# Patient Record
Sex: Female | Born: 1980 | Hispanic: No | Marital: Married | Smoking: Former smoker
Health system: Southern US, Community
[De-identification: ages and names within clinical notes are randomized; demographics above are authoritative.]

## PROBLEM LIST (undated history)

## (undated) DIAGNOSIS — M1990 Unspecified osteoarthritis, unspecified site: Secondary | ICD-10-CM

---

## 2010-07-20 ENCOUNTER — Ambulatory Visit (HOSPITAL_COMMUNITY)
Admission: RE | Admit: 2010-07-20 | Discharge: 2010-07-20 | Payer: Self-pay | Source: Home / Self Care | Attending: *Deleted | Admitting: *Deleted

## 2011-11-23 ENCOUNTER — Observation Stay (HOSPITAL_COMMUNITY)
Admission: EM | Admit: 2011-11-23 | Discharge: 2011-11-23 | Disposition: A | Discharge status: Home / Self Care | Payer: BC Managed Care – PPO | Attending: Emergency Medicine | Admitting: Emergency Medicine

## 2011-11-23 ENCOUNTER — Emergency Department (HOSPITAL_COMMUNITY): Payer: BC Managed Care – PPO

## 2011-11-23 ENCOUNTER — Encounter (HOSPITAL_COMMUNITY): Payer: Self-pay | Admitting: Emergency Medicine

## 2011-11-23 DIAGNOSIS — R112 Nausea with vomiting, unspecified: Secondary | ICD-10-CM | POA: Insufficient documentation

## 2011-11-23 DIAGNOSIS — K529 Noninfective gastroenteritis and colitis, unspecified: Secondary | ICD-10-CM

## 2011-11-23 DIAGNOSIS — R109 Unspecified abdominal pain: Principal | ICD-10-CM | POA: Insufficient documentation

## 2011-11-23 DIAGNOSIS — E860 Dehydration: Secondary | ICD-10-CM | POA: Insufficient documentation

## 2011-11-23 LAB — CBC
HCT: 38.7 % (ref 36.0–46.0)
Hemoglobin: 12.5 g/dL (ref 12.0–15.0)
MCH: 30.8 pg (ref 26.0–34.0)
MCHC: 32.3 g/dL (ref 30.0–36.0)
MCV: 95.3 fL (ref 78.0–100.0)
Platelets: 274 K/uL (ref 150–400)
RBC: 4.06 MIL/uL (ref 3.87–5.11)
RDW: 12.5 % (ref 11.5–15.5)
WBC: 8.2 K/uL (ref 4.0–10.5)

## 2011-11-23 LAB — BASIC METABOLIC PANEL
CO2: 26 mEq/L (ref 19–32)
Calcium: 9.4 mg/dL (ref 8.4–10.5)
Chloride: 102 mEq/L (ref 96–112)
Creatinine, Ser: 0.73 mg/dL (ref 0.50–1.10)
GFR calc Af Amer: >90 mL/min (ref >90)
Glucose, Bld: 87 mg/dL (ref 70–99)
Sodium: 138 mEq/L (ref 135–145)

## 2011-11-23 LAB — URINALYSIS, DIPSTICK ONLY
Bilirubin Urine: NEGATIVE
Ketones, ur: NEGATIVE mg/dL
Leukocytes, UA: NEGATIVE
Nitrite: NEGATIVE
Protein, ur: NEGATIVE mg/dL
Urobilinogen, UA: 0.2 mg/dL (ref 0.0–1.0)
pH: 6.5 (ref 5.0–8.0)

## 2011-11-23 LAB — URINALYSIS, ROUTINE W REFLEX MICROSCOPIC
Bilirubin Urine: NEGATIVE
Ketones, ur: NEGATIVE mg/dL
Nitrite: NEGATIVE
Specific Gravity, Urine: 1.029 (ref 1.005–1.030)
Urobilinogen, UA: 1 mg/dL (ref 0.0–1.0)

## 2011-11-23 LAB — DIFFERENTIAL
Eosinophils Relative: 2 % (ref 0–5)
Lymphocytes Relative: 34 % (ref 12–46)
Lymphs Abs: 2.8 10*3/uL (ref 0.7–4.0)
Monocytes Absolute: 0.6 10*3/uL (ref 0.1–1.0)
Neutro Abs: 4.6 10*3/uL (ref 1.7–7.7)

## 2011-11-23 MED ORDER — MORPHINE SULFATE 4 MG/ML IJ SOLN
4.0000 mg | Freq: Once | INTRAMUSCULAR | Status: AC
  Administered 2011-11-23: 4 mg via INTRAVENOUS
  Filled 2011-11-23: qty 1

## 2011-11-23 MED ORDER — SODIUM CHLORIDE 0.9 % IV SOLN: 1000.0000 mL | INTRAVENOUS | Status: DC

## 2011-11-23 MED ORDER — MORPHINE SULFATE 2 MG/ML IJ SOLN
2.0000 mg | INTRAMUSCULAR | Status: DC | PRN
  Administered 2011-11-23: 2 mg via INTRAVENOUS
  Filled 2011-11-23: qty 1

## 2011-11-23 MED ORDER — SODIUM CHLORIDE 0.9 % IV SOLN
INTRAVENOUS | Status: DC
  Administered 2011-11-23: 03:00:00 via INTRAVENOUS

## 2011-11-23 MED ORDER — MORPHINE SULFATE 4 MG/ML IJ SOLN
INTRAMUSCULAR | Status: AC
  Filled 2011-11-23: qty 1

## 2011-11-23 MED ORDER — ONDANSETRON 8 MG PO TBDP: 8.0000 mg | ORAL_TABLET | Freq: Three times a day (TID) | ORAL | Status: AC | PRN

## 2011-11-23 MED ORDER — ONDANSETRON HCL 4 MG/2ML IJ SOLN
INTRAMUSCULAR | Status: AC
  Filled 2011-11-23: qty 2

## 2011-11-23 MED ORDER — ONDANSETRON 4 MG PO TBDP: 8.0000 mg | ORAL_TABLET | Freq: Once | ORAL | Status: DC

## 2011-11-23 MED ORDER — ONDANSETRON HCL 4 MG/2ML IJ SOLN
4.0000 mg | Freq: Once | INTRAMUSCULAR | Status: AC
  Administered 2011-11-23: 4 mg via INTRAVENOUS

## 2011-11-23 MED ORDER — KETOROLAC TROMETHAMINE 30 MG/ML IJ SOLN
30.0000 mg | Freq: Once | INTRAMUSCULAR | Status: AC
  Administered 2011-11-23: 30 mg via INTRAVENOUS
  Filled 2011-11-23: qty 1

## 2011-11-23 MED ORDER — HYDROCODONE-ACETAMINOPHEN 5-325 MG PO TABS: 1.0000 | ORAL_TABLET | ORAL | Status: AC | PRN

## 2011-11-23 MED ORDER — MORPHINE SULFATE 4 MG/ML IJ SOLN
4.0000 mg | Freq: Once | INTRAMUSCULAR | Status: AC
  Administered 2011-11-23: 4 mg via INTRAVENOUS

## 2011-11-23 MED ORDER — ONDANSETRON 4 MG PO TBDP
8.0000 mg | ORAL_TABLET | Freq: Once | ORAL | Status: AC
  Administered 2011-11-23: 8 mg via ORAL
  Filled 2011-11-23: qty 2

## 2011-11-23 MED ORDER — ONDANSETRON HCL 4 MG/2ML IJ SOLN
4.0000 mg | Freq: Four times a day (QID) | INTRAMUSCULAR | Status: DC | PRN
  Administered 2011-11-23: 4 mg via INTRAVENOUS
  Filled 2011-11-23: qty 2

## 2011-11-23 NOTE — ED Provider Notes (Signed)
Patient in CDU under dehydration protocol.  Patient resting comfortably at present with family at bedside.  Nausea and vomiting currently controlled, is tolerating po fluids without difficulty.  Lungs CTA bilaterally.  S1/S2, RRR, no murmur.  Abdomen soft, bowel sounds present.  Lab and radiology results reviewed and discussed with patient.  Will be discharged home with prescriptions for antiemetic and analgesic.  Patient has a PCP in [place], and will follow up with him as needed.  Jimmye Norman, NP 11/23/11 1357  Prior to discharge, patient experienced episode of lightheadedness and near syncope.  Episode quickly resolved.  Patient reassessed, is not orthostatic.  CBG at 68, patient provided with snack.  Small amount of blood noted in urine recheck, leukocyte esterase and nitrate negative.  CT of A/P, as well as pelvic ultrasounds are unremarkable.  Will culture urine and contact patient if additional treatment is indicated.  Jimmye Norman, NP 11/23/11 [phone]

## 2011-11-23 NOTE — ED Notes (Signed)
Pt given 2 cups of Orange Juice & snack, pt in bed & responsive, family at bedside, vitals within normal limits, will continue to monitor

## 2011-11-23 NOTE — ED Notes (Signed)
MD at bedside. 

## 2011-11-23 NOTE — Discharge Instructions (Signed)
Clear Liquid Diet °The clear liquid diet consists of foods that are liquid or will become liquid at room temperature. You should be able to see through the liquid and beverages. Examples of foods allowed on a clear liquid diet include fruit juice, broth or bouillon, gelatin, or frozen ice pops. °The purpose of this diet is to provide necessary fluid, electrolytes such as sodium and potassium, and energy to keep the body functioning during times when you are not able to consume a regular diet. A clear liquid diet should not be continued for long periods of time as it is not nutritionally adequate.  °REASONS FOR USING A CLEAR LIQUID DIET °· In sudden onset (acute) conditions for a patient before or after surgery.  °· As the first step in oral feeding.  °· For fluid and electrolyte replacement in diarrheal diseases.  °· As a diet before certain medical tests are performed.  °ADEQUACY °The clear liquid diet is adequate only in ascorbic acid, according to the Recommended Dietary Allowances of the National Research Council. °CHOOSING FOODS °Breads and Starches °· Allowed:  None are allowed.  °· Avoid: All are avoided.  °Vegetables °· Allowed:  Strained tomato or vegetable juice.  °· Avoid: Any others.  °Fruit °· Allowed:  Strained fruit juices and fruit drinks. Include 1 serving of citrus or vitamin C-enriched fruit juice daily.  °· Avoid: Any others.  °Meat and Meat Substitutes °· Allowed:  None are allowed.  °· Avoid: All are avoided.  °Milk °· Allowed:  None are allowed.  °· Avoid: All are avoided.  °Soups and Combination Foods °· Allowed:  Clear bouillon, broth, or strained broth-based soups.  °· Avoid: Any others.  °Desserts and Sweets °· Allowed:  Sugar, honey. High protein gelatin. Flavored gelatin, ices, or frozen ice pops that do not contain milk.  °· Avoid: Any others.  °Fats and Oils °· Allowed:  None are allowed.  °· Avoid: All are avoided.  °Beverages °· Allowed: Cereal beverages, coffee (regular or  decaffeinated), tea, or soda at the discretion of your caregiver.  °· Avoid: Any others.  °Condiments °· Allowed:  Iodized salt.  °· Avoid: Any others, including pepper.  °Supplements °· Allowed:  Liquid nutrition beverages.  °· Avoid: Any others that contain lactose or fiber.  °SAMPLE MEAL PLAN °Breakfast °· 4 oz (120 mL) strained orange juice.  °· ½ to 1 cup (125 to 250 mL) gelatin (plain or fortified).  °· 1 cup (250 mL) beverage (coffee or tea).  °· Sugar, if desired.  °Midmorning Snack °· ½ cup (125 mL) gelatin (plain or fortified).  °Lunch °· 1 cup (250 mL) broth or consommé.  °· 4 oz (120 mL) strained grapefruit juice.  °· ½ cup (125 mL) gelatin (plain or fortified).  °· 1 cup (250 mL) beverage (coffee or tea).  °· Sugar, if desired.  °Midafternoon Snack °· ½ cup (125 mL) fruit ice.  °· ½ cup (125 mL) strained fruit juice.  °Dinner °· 1 cup (250 mL) broth or consommé.  °· ½ cup (125 mL) cranberry juice.  °· ½ cup (125 mL) flavored gelatin (plain or fortified).  °· 1 cup (250 mL) beverage (coffee or tea).  °· Sugar, if desired.  °Evening Snack °· 4 oz (120 mL) strained apple juice (vitamin C-fortified).  °· ½ cup (125 mL) flavored gelatin (plain or fortified).  °Document Released: 06/18/2005 Document Revised: 06/07/2011 Document Reviewed: 09/15/2010 °[employer]® Patient Information ©2012 [employer].B.R.A.T. Diet °Your doctor has recommended the B.R.A.T. diet for you or   you or your child until the condition improves. This is often used to help control diarrhea and vomiting symptoms. If you or your child can tolerate clear liquids, you may have:  Bananas.   Rice.   Applesauce.   Toast (and other simple starches such as crackers, potatoes, noodles).  Be sure to avoid dairy products, meats, and fatty foods until symptoms are better. Fruit juices such as apple, grape, and prune juice can make diarrhea worse. Avoid these. Continue this diet for 2 days or as instructed by your caregiver. Document Released:  06/18/2005 Document Revised: 06/07/2011 Document Reviewed: 12/05/2006 [facility] Patient Information 2012 [place], [place].  Nausea and Vomiting Nausea is a sick feeling that often comes before throwing up (vomiting). Vomiting is a reflex where stomach contents come out of your mouth. Vomiting can cause severe loss of body fluids (dehydration). Children and elderly adults can become dehydrated quickly, especially if they also have diarrhea. Nausea and vomiting are symptoms of a condition or disease. It is important to find the cause of your symptoms. CAUSES   Direct irritation of the stomach lining. This irritation can result from increased acid production (gastroesophageal reflux disease), infection, food poisoning, taking certain medicines (such as nonsteroidal anti-inflammatory drugs), alcohol use, or tobacco use.   Signals from the brain.These signals could be caused by a headache, heat exposure, an inner ear disturbance, increased pressure in the brain from injury, infection, a tumor, or a concussion, pain, emotional stimulus, or metabolic problems.   An obstruction in the gastrointestinal tract (bowel obstruction).   Illnesses such as diabetes, hepatitis, gallbladder problems, appendicitis, kidney problems, cancer, sepsis, atypical symptoms of a heart attack, or eating disorders.   Medical treatments such as chemotherapy and radiation.   Receiving medicine that makes you sleep (general anesthetic) during surgery.  DIAGNOSIS Your caregiver may ask for tests to be done if the problems do not improve after a few days. Tests may also be done if symptoms are severe or if the reason for the nausea and vomiting is not clear. Tests may include:  Urine tests.   Blood tests.   Stool tests.   Cultures (to look for evidence of infection).   X-rays or other imaging studies.  Test results can help your caregiver make decisions about treatment or the need for additional tests. TREATMENT You need  to stay well hydrated. Drink frequently but in small amounts.You may wish to drink water, sports drinks, clear broth, or eat frozen ice pops or gelatin dessert to help stay hydrated.When you eat, eating slowly may help prevent nausea.There are also some antinausea medicines that may help prevent nausea. HOME CARE INSTRUCTIONS   Take all medicine as directed by your caregiver.   If you do not have an appetite, do not force yourself to eat. However, you must continue to drink fluids.   If you have an appetite, eat a normal diet unless your caregiver tells you differently.   Eat a variety of complex carbohydrates (rice, wheat, potatoes, bread), lean meats, yogurt, fruits, and vegetables.   Avoid high-fat foods because they are more difficult to digest.   Drink enough water and fluids to keep your urine clear or pale yellow.   If you are dehydrated, ask your caregiver for specific rehydration instructions. Signs of dehydration may include:   Severe thirst.   Dry lips and mouth.   Dizziness.   Dark urine.   Decreasing urine frequency and amount.   Confusion.   Rapid breathing or pulse.  SEEK  IMMEDIATE MEDICAL CARE IF:   You have blood or brown flecks (like coffee grounds) in your vomit.   You have black or bloody stools.   You have a severe headache or stiff neck.   You are confused.   You have severe abdominal pain.   You have chest pain or trouble breathing.   You do not urinate at least once every 8 hours.   You develop cold or clammy skin.   You continue to vomit for longer than 24 to 48 hours.   You have a fever.  MAKE SURE YOU:   Understand these instructions.   Will watch your condition.   Will get help right away if you are not doing well or get worse.  Document Released: 06/18/2005 Document Revised: 06/07/2011 Document Reviewed: 11/15/2010 [facility] Patient Information 2012 [place], [place].

## 2011-11-23 NOTE — ED Notes (Signed)
Patient with abdominal pain and vomiting since 1900 this evening.  Patient states that it is more pain on the right side.

## 2011-11-23 NOTE — ED Notes (Addendum)
Pt reports blood in urine prior to leaving hospital, Katrinka Blazing, NP notified, pt & family updated on plan of care, pt c/o nausea

## 2011-11-23 NOTE — ED Notes (Signed)
Pt's abdomen is soft, and tender to palpation to RLQ.  Pt reports feeling "a little" pain to LLQ abdomen with palpation to R side.

## 2011-11-23 NOTE — ED Notes (Signed)
Notified lab on delay of u preg, they say they will run the test now

## 2011-11-23 NOTE — ED Notes (Signed)
Pt given graham crackers and orange juice per request.  Pt eating at this time.  Will d/c pt after eating.

## 2011-11-23 NOTE — ED Notes (Signed)
Patient in ultrasound, became unresponsive with the ultrasound technician, patient currently alert and oriented x 4, placed on monitor, vitals stable.

## 2011-11-23 NOTE — ED Notes (Signed)
Pt tolerating PO fluids without nausea

## 2011-11-23 NOTE — ED Notes (Signed)
Pt advocate to bedside.  Pt and family talking and denied any needs.

## 2011-11-23 NOTE — ED Provider Notes (Cosign Needed)
History     CSN: [id number]  Arrival date & time 11/23/11  0106   First MD Initiated Contact with Patient 11/23/11 0406      Chief Complaint  Patient presents with  . Abdominal Pain  . Emesis    (Consider location/radiation/quality/duration/timing/severity/associated sxs/prior treatment) Patient is a 31 y.o. female presenting with abdominal pain and vomiting. The history is provided by the patient and the spouse.  Abdominal Pain The primary symptoms of the illness include abdominal pain, nausea and vomiting. The primary symptoms of the illness do not include fever, diarrhea, dysuria, vaginal discharge or vaginal bleeding.  Symptoms associated with the illness do not include chills or constipation.  Emesis  Associated symptoms include abdominal pain. Pertinent negatives include no chills, no diarrhea and no fever.   the patient is a 31 year old, female, with a history of C-section x3, as well as bilateral tubal ligation.  She presents to emergency department complaining of gradual onset of right lower abdominal pain, with nausea and vomiting.  She denies diarrhea.  She denies urinary tract symptoms.  She completed her menstrual cycle.  3 days ago.  It was normal.  She does not have a vaginal discharge.  Her last meal was around 7:00 last night.  Her symptoms began around 6 PM last night.  History reviewed. No pertinent past medical history.  Past Surgical History  Procedure Date  . Cesarean section     3    History reviewed. No pertinent family history.  History  Substance Use Topics  . Smoking status: Former Games developer  . Smokeless tobacco: Not on file  . Alcohol Use: No    OB History    Grav Para Term Preterm Abortions TAB SAB Ect Mult Living                  Review of Systems  Constitutional: Negative for fever and chills.  Gastrointestinal: Positive for nausea, vomiting and abdominal pain. Negative for diarrhea, constipation and blood in stool.  Genitourinary:  Negative for dysuria, vaginal bleeding and vaginal discharge.  All other systems reviewed and are negative.    Allergies  Peanut-containing drug products; Shellfish allergy; and Iodine  Home Medications   Current Outpatient Rx  Name Route Sig Dispense Refill  . CETIRIZINE HCL 10 MG PO TABS Oral Take 10 mg by mouth daily.    Marland Kitchen CITALOPRAM HYDROBROMIDE 20 MG PO TABS Oral Take 20 mg by mouth daily.    . METHYLPHENIDATE HCL ER 18 MG PO TBCR Oral Take 18 mg by mouth every morning.      BP 133/80  Pulse 74  Temp(Src) 98 F (36.7 C) (Oral)  Resp 16  SpO2 96%  LMP 11/13/2011  Physical Exam  Nursing note and vitals reviewed. Constitutional: She is oriented to person, place, and time. She appears well-developed and well-nourished. No distress.       Uncomfortable appearing  HENT:  Head: Normocephalic and atraumatic.  Eyes: Conjunctivae are normal.  Neck: Normal range of motion. Neck supple.  Cardiovascular: Normal rate.   No murmur heard. Pulmonary/Chest: Effort normal. She has no rales.  Abdominal: Soft. Bowel sounds are normal. She exhibits no distension. There is tenderness. There is guarding. There is no rebound.       Right lower quadrant tenderness, with guarding.  No rigidity  Musculoskeletal: Normal range of motion. She exhibits no edema.  Neurological: She is alert and oriented to person, place, and time.  Skin: Skin is warm and dry.  Psychiatric:  She has a normal mood and affect. Thought content normal.    ED Course  Procedures (including critical care time) Gradual onset of right lower quadrant abdominal pain, with nausea and vomiting.  She has significant tenderness and guarding in the right side.  We will perform laboratory testing, and a CAT scan of her abdomen.  For suspected appendicitis.  Labs Reviewed  URINALYSIS, ROUTINE W REFLEX MICROSCOPIC - Abnormal; Notable for the following:    APPearance CLOUDY (*)    All other components within normal limits  CBC    DIFFERENTIAL  BASIC METABOLIC PANEL  PREGNANCY, URINE   No results found.   No diagnosis found.    MDM  Right lower quadrant abdominal pain, with nausea and vomiting        Cheri Guppy, MD 11/23/11 2307

## 2011-11-23 NOTE — ED Notes (Addendum)
Informed  By pt family that pt was unresponsive, when entering the room the pt was in bed not responding, ammonia did arouse the pt, pts respirations & pulse remained within normal limits during this time, unresponsive episode lasted about 1-2 mins, pt placed on 2 L [place] during episode, pt removed from O2 once aroused, Katrinka Blazing, NP at bedside, CBG to be tested

## 2011-11-23 NOTE — ED Notes (Signed)
Received pt from ED with continued RLQ abdominal pain.  Pt reports pain began yesterday while driving to work after watching son play T-ball.  Pt reports decreased urine output, reports period was 3 days ago and has vaginal discharge since period ended, but pt reports that is normal for her.  Pt vital signs are stable, pt reports pain is 6/10.  Female visitor at bedside.

## 2011-11-24 LAB — URINE CULTURE

## 2011-11-26 NOTE — ED Provider Notes (Signed)
Medical screening examination/treatment/procedure(s) were performed by non-physician practitioner and as supervising physician I was immediately available for consultation/collaboration.  Kennedey Digilio, MD 11/26/11 0729 

## 2013-06-25 IMAGING — US US PELVIS COMPLETE
1 series · 14 of 25 positions shown · non-contrast
Comparison: None.

CLINICAL DATA: Severe right pelvic pain.  Clinical suspicion for
ovarian torsion.

TRANSABDOMINAL AND TRANSVAGINAL ULTRASOUND OF PELVIS
DOPPLER ULTRASOUND OF OVARIES
TECHNIQUE: Both transabdominal and transvaginal ultrasound
examinations of the pelvis were performed. Transabdominal technique
was performed for global imaging of the pelvis including uterus,
ovaries, adnexal regions, and pelvic cul-de-sac.
It was necessary to proceed with endovaginal exam following the
transabdominal exam to visualize the ovaries and  adnexa.
Color and duplex Doppler ultrasound was utilized to evaluate blood
flow to the ovaries.

[Series 1: us pelvis complete · 0.28mm/px · 14 of 56 slices shown]
[im 1/56]
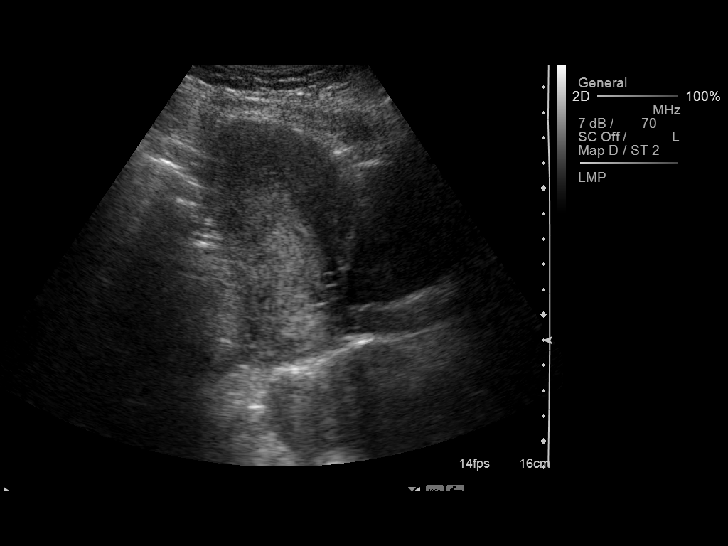
[im 5/56]
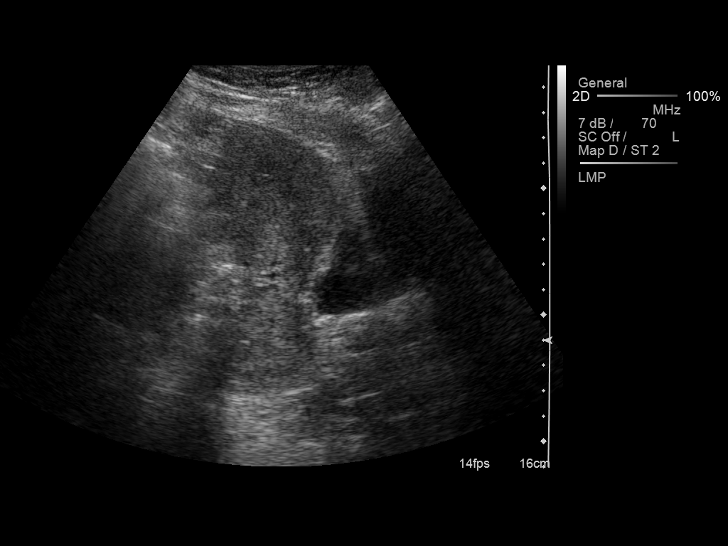
[im 10/56]
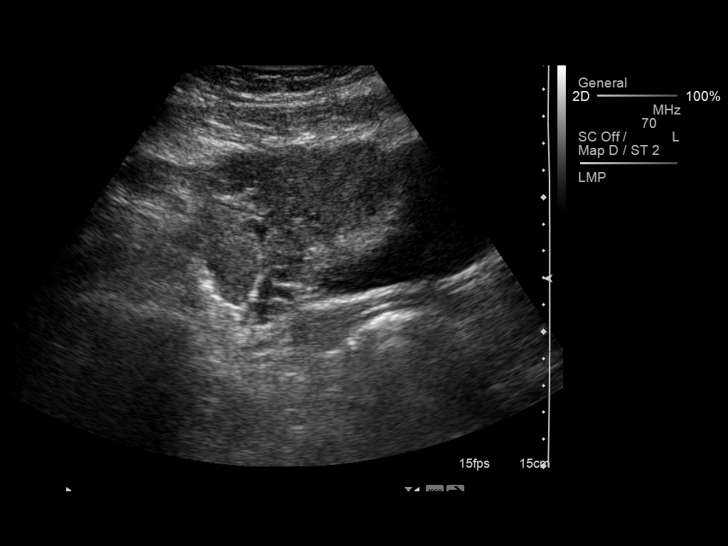
[im 14/56]
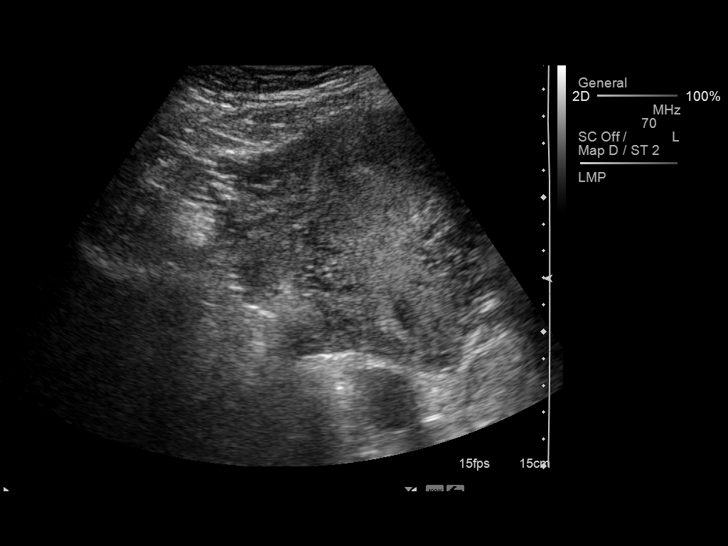
[im 19/56]
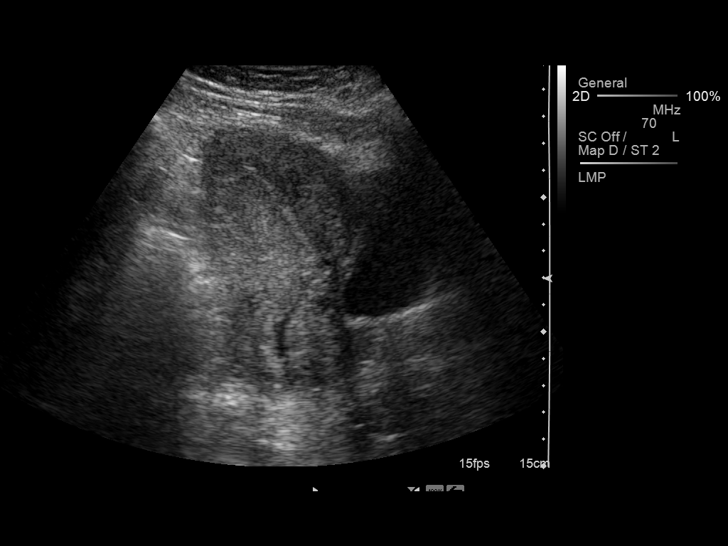
[im 21/56]
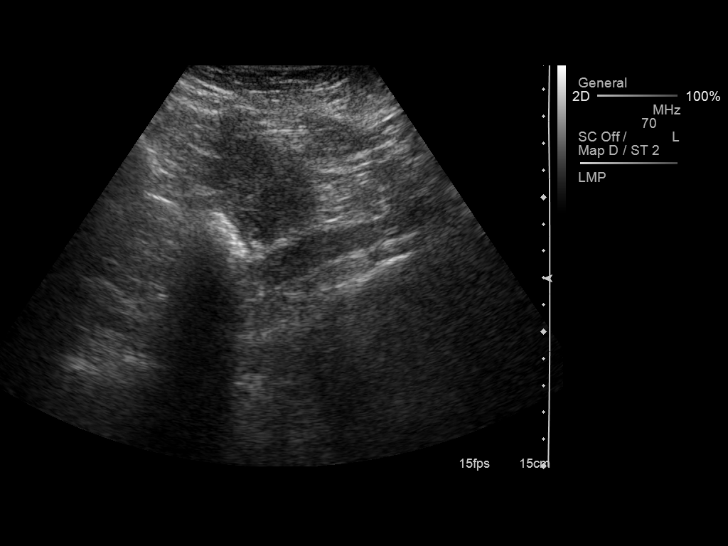
[im 26/56]
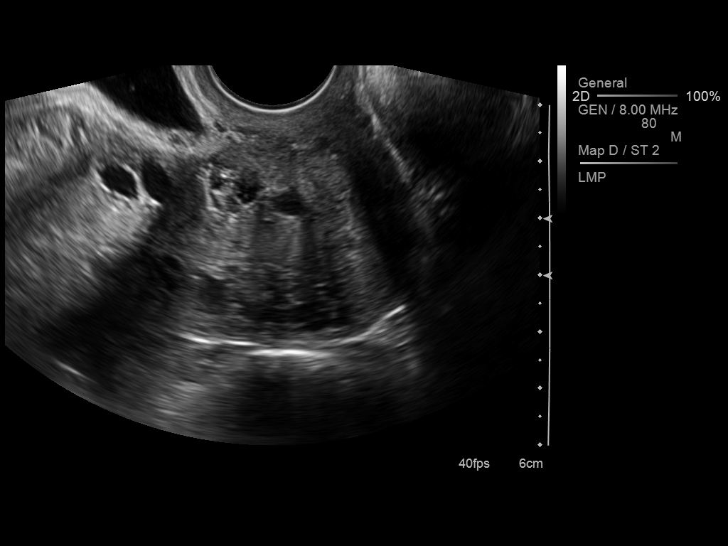
[im 30/56]
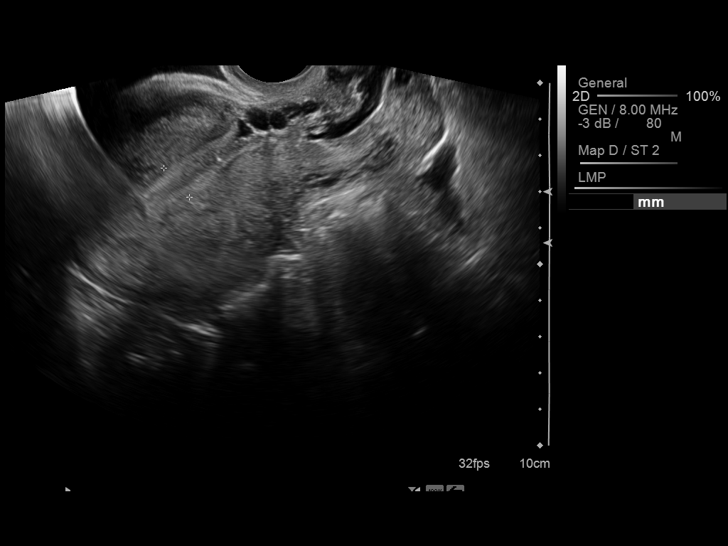
[im 35/56]
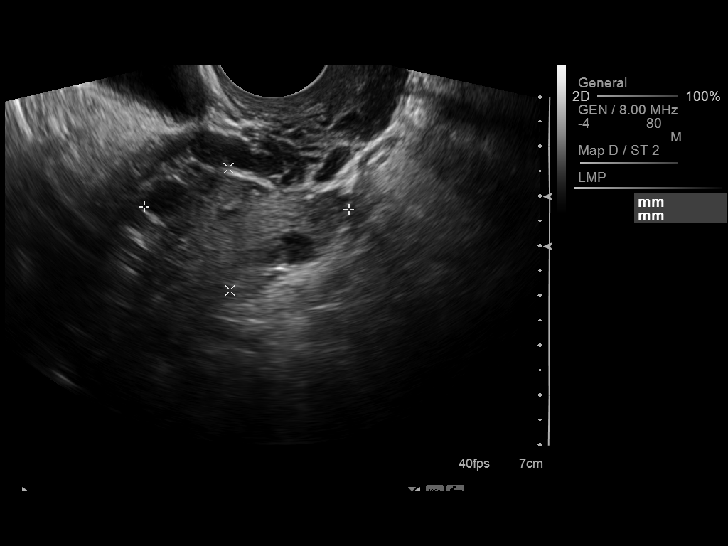
[im 37/56]
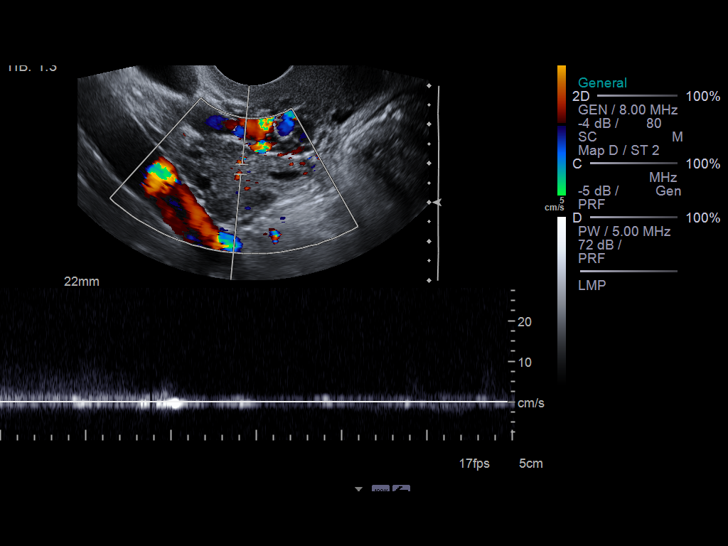
[im 42/56]
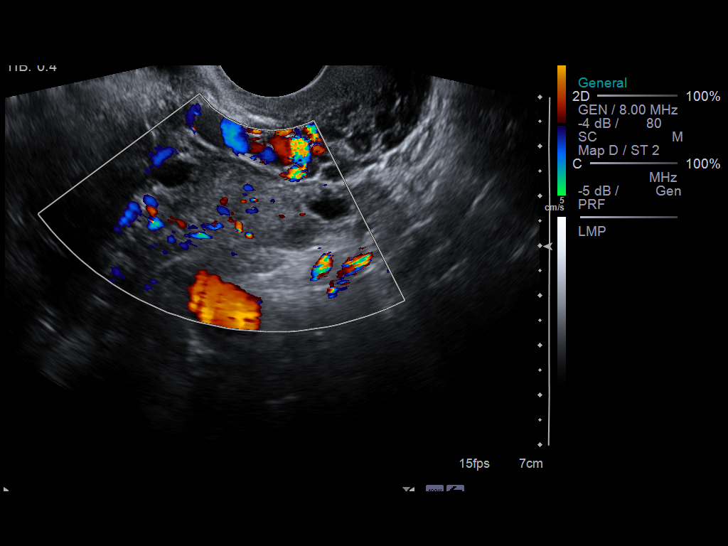
[im 46/56]
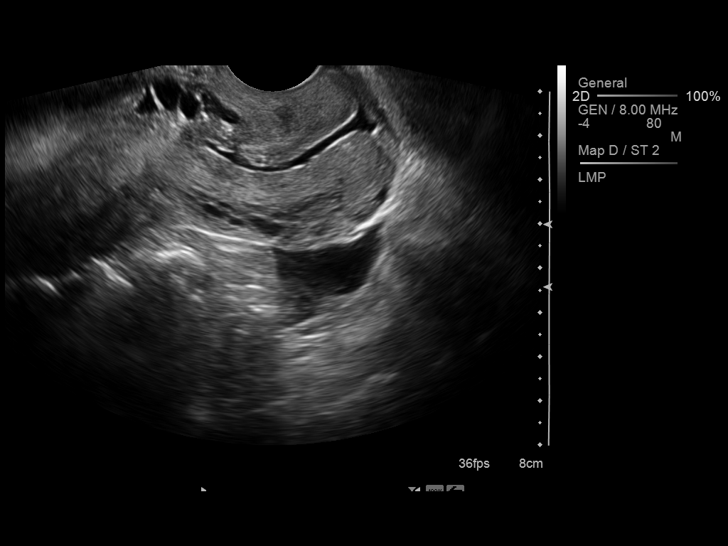
[im 51/56]
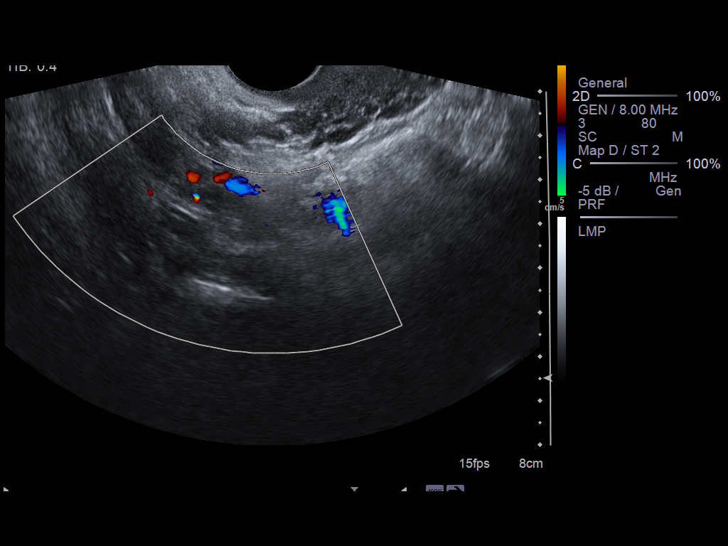
[im 56/56]
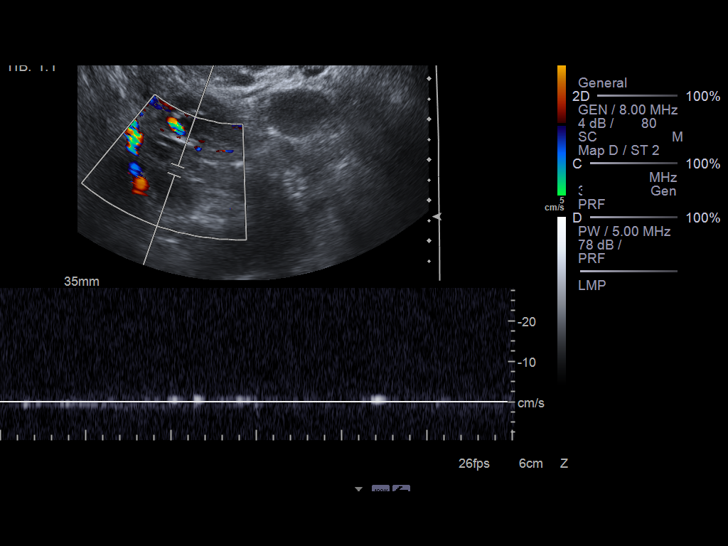

[14 of 25 positions shown; findings below may reference images not displayed]

FINDINGS: Uterus:  10.0 x 6.0 by 6.8 cm.  No fibroids or other uterine mass
identified.

Endometrium:  Double layer thickness measures 11 mm transvaginally.
Normal trilayered appearance.

Right ovary: 4.1 x 2.5 x 3.4 cm.  Normal appearance.

Left ovary: 2.8 x 1.6 x 2.4 cm.  Normal appearance.

Pulsed Doppler evaluation demonstrates normal low-resistance
arterial and venous waveforms in both ovaries.
IMPRESSION: Normal exam.  No evidence of pelvic mass or other significant
abnormality.

No sonographic evidence for ovarian torsion.

## 2013-06-25 IMAGING — CT CT ABD-PELV W/O CM
2 of 4 series · 17 of 46 positions shown, 19 images · non-contrast
Comparison: None.

CLINICAL DATA: Right lower quadrant pain.  Concern for
appendicitis.

CT ABDOMEN AND PELVIS WITHOUT CONTRAST
TECHNIQUE: Multidetector CT imaging of the abdomen and pelvis was
performed following the standard protocol without intravenous
contrast.

[Series 2: abd/pelv w/o 5.0 b31f st · axial · non-contrast · 0.69mm/px · z∈[-488,-28]mm · 14 of 102 slices shown, 16 images]
[im 5/102  soft-tissue]
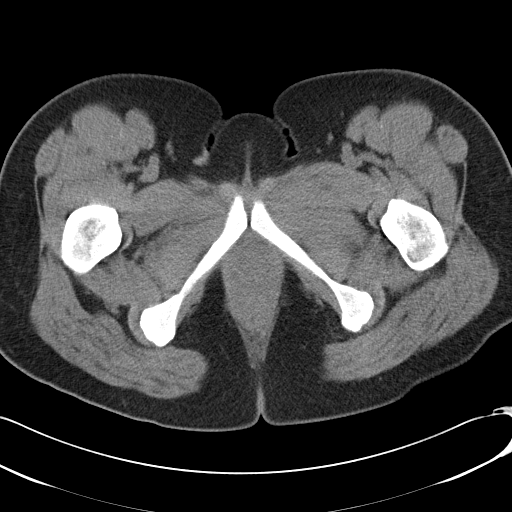
[im 5/102  bone]
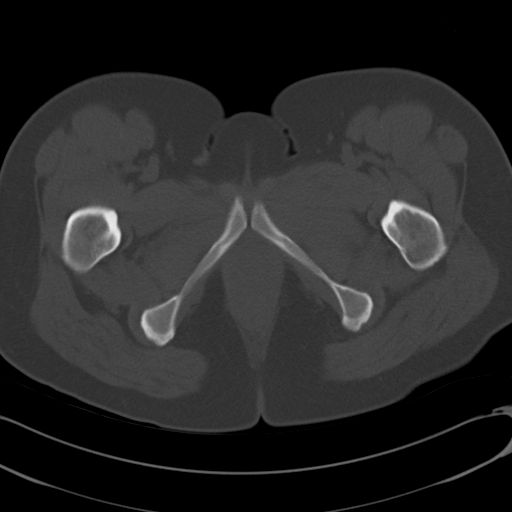
[im 13/102  soft-tissue]
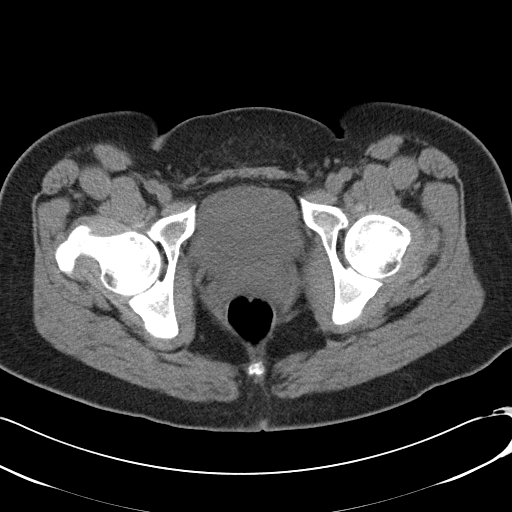
[im 21/102  soft-tissue]
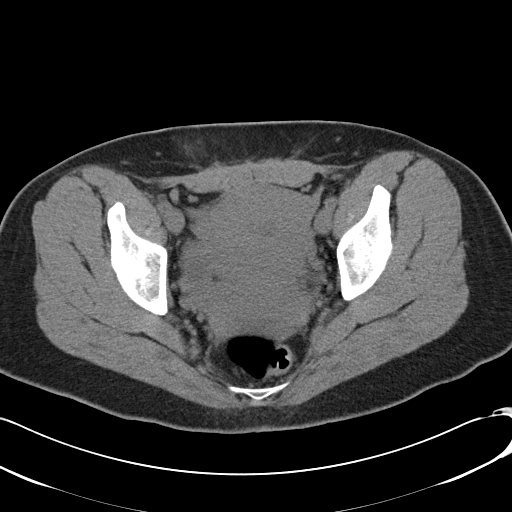
[im 29/102  soft-tissue]
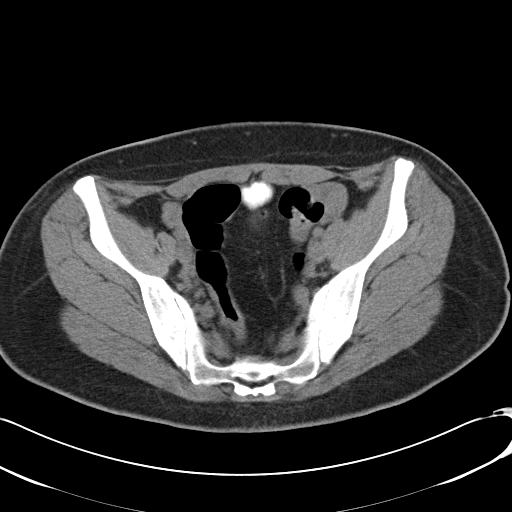
[im 33/102  soft-tissue]
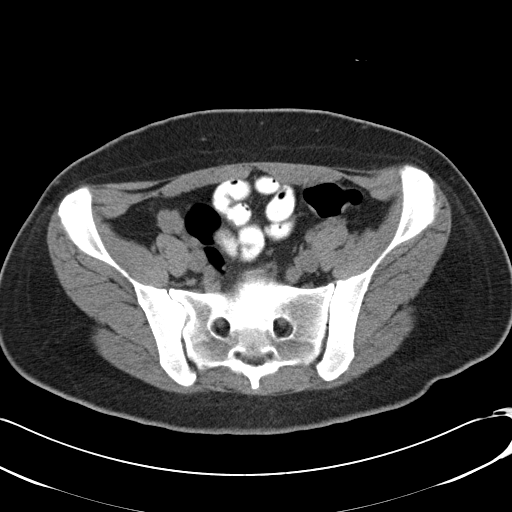
[im 41/102  soft-tissue]
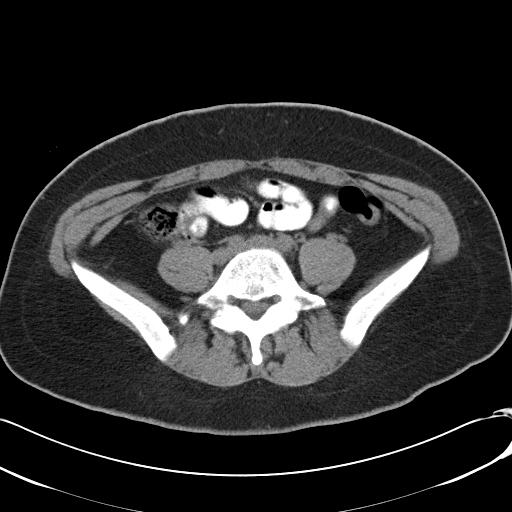
[im 49/102  soft-tissue]
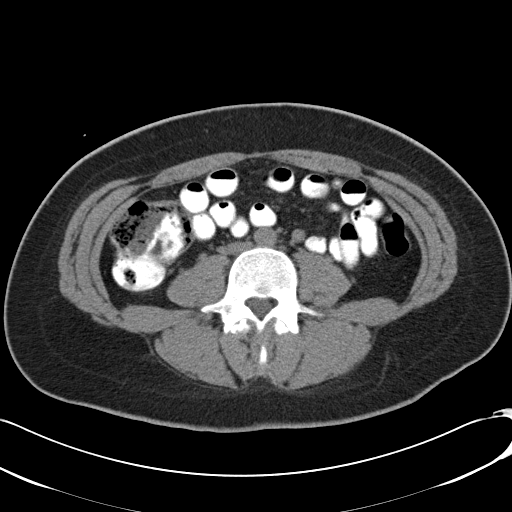
[im 53/102  soft-tissue]
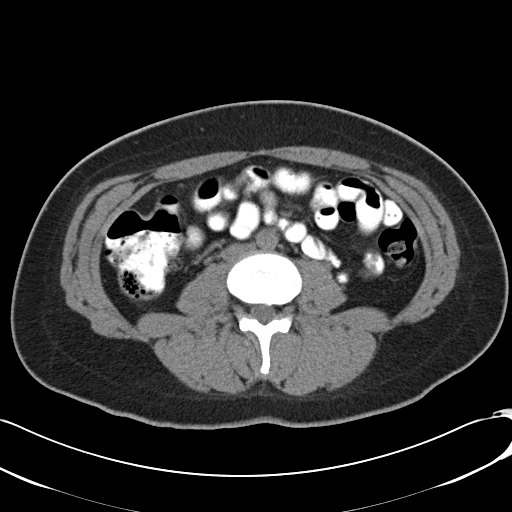
[im 61/102  soft-tissue]
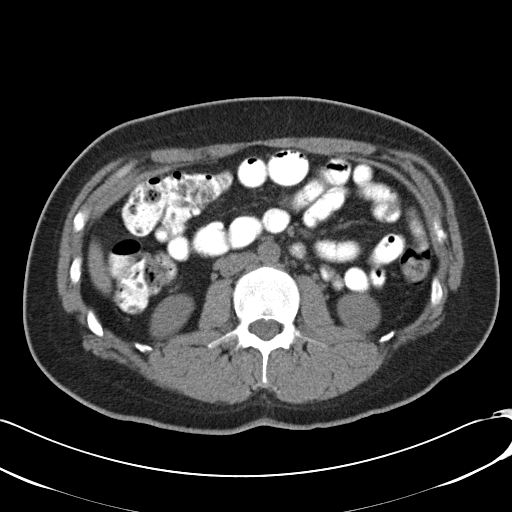
[im 61/102  bone]
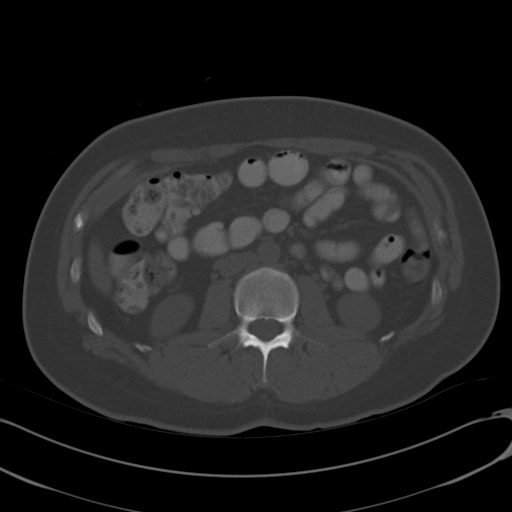
[im 69/102  soft-tissue]
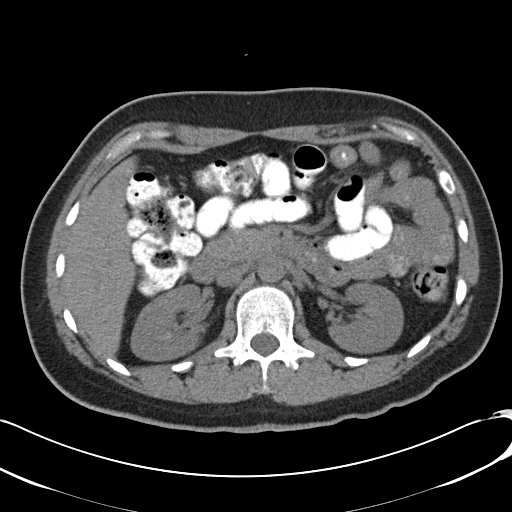
[im 77/102  soft-tissue]
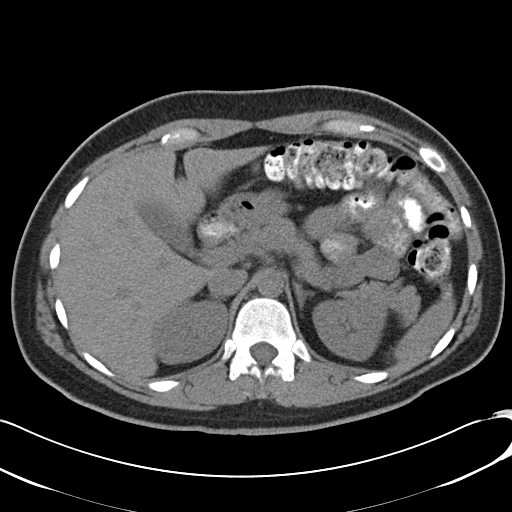
[im 81/102  soft-tissue]
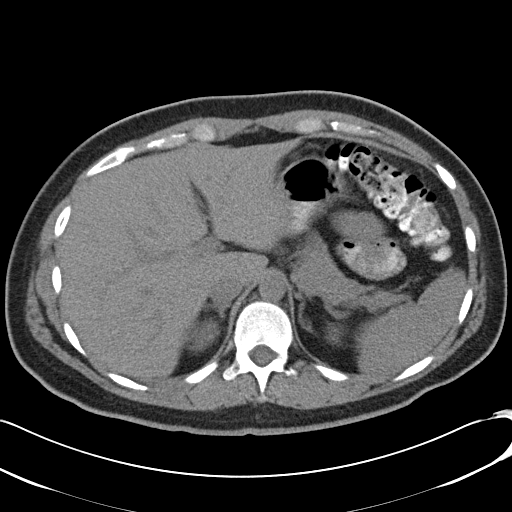
[im 89/102  soft-tissue]
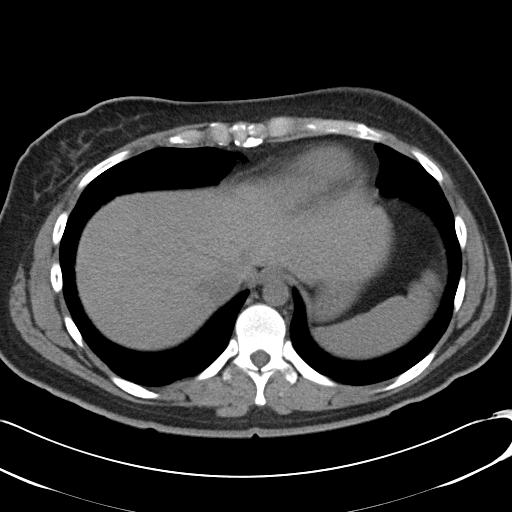
[im 97/102  soft-tissue]
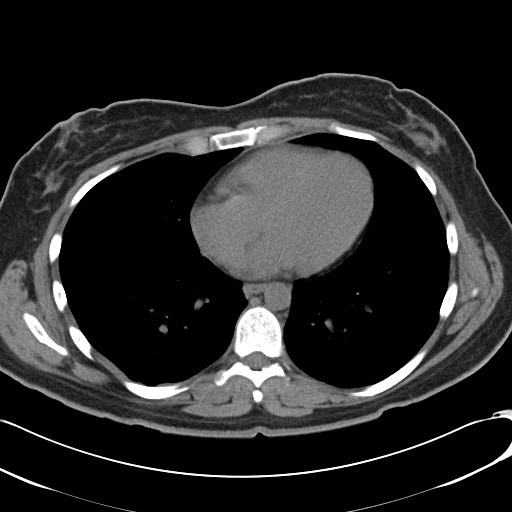

[Series 5: coronals · coronal · 0.95mm/px · 3 of 109 slices shown]
[im 37/109  soft-tissue]
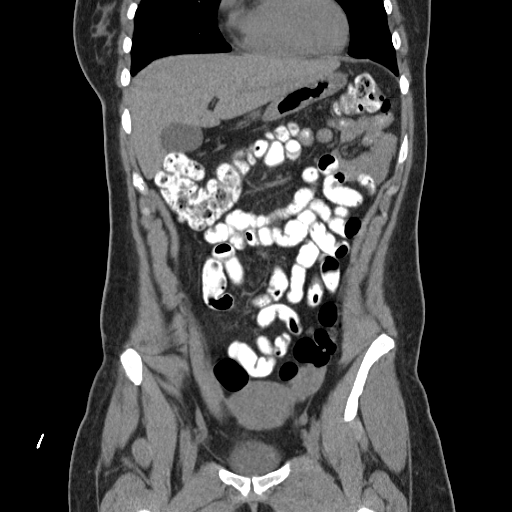
[im 49/109  soft-tissue]
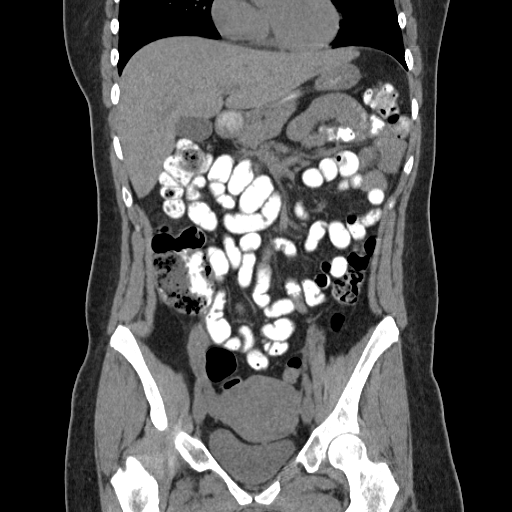
[im 61/109  soft-tissue]
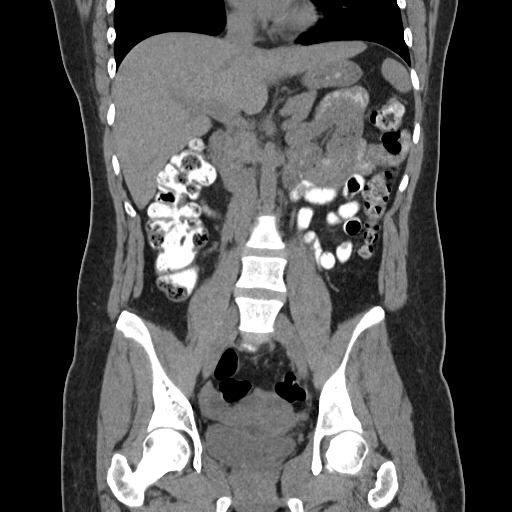

[17 of 46 positions shown; findings below may reference images not displayed]

FINDINGS: Lung bases are clear.  No focal hepatic lesion.  The
gallbladder, pancreas, spleen, adrenal glands, and kidneys are
normal.

The stomach, small bowel, appendix, and colon are normal.

Abdominal aorta normal caliber.  No retroperitoneal periportal
lymphadenopathy.

No free fluid the pelvis.  The bladder and uterus and ovaries are
normal. Dominant follicle in the right ovary.  No pelvic
lymphadenopathy. Review of  bone windows demonstrates no aggressive
osseous lesions.
IMPRESSION: 1..  Normal appendix.
2.  No explanation for right lower quadrant pain.

## 2017-12-23 DIAGNOSIS — R0602 Shortness of breath: Secondary | ICD-10-CM

## 2017-12-23 NOTE — ED Notes (Signed)
Adds that she was put on a steroid taper, but only took 3 doses because it made her feel so miserable.

## 2017-12-23 NOTE — ED Notes (Signed)
 Has been sick with and upper respiratory infection for a while and it feels like I can't get over it   Took 2-325 aspirin on the way here (chewed one and swallowed one).  Occasionally has a big beat of my heart which makes me cough then it goes away. It feels like an elephant is sitting on my chest.    [place] at [employer] today and said she felt worse once carrying daughter to car.    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

## 2017-12-23 NOTE — ED Triage Notes (Signed)
Has been sick with and upper respiratory infection for a while "and it feels like I can't get over it"   Took 2-325 aspirin on the way here (chewed one and swallowed one).  Occasionally "has a big beat of my heart which makes me cough then it goes away. It feels like an elephant is sitting on my chest."    Was at [employer] today and said she felt worse once carrying daughter to car.    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

## 2017-12-23 NOTE — ED Triage Notes (Signed)
Adds that she was put on a steroid taper, but only took 3 doses because it made her feel so miserable.

## 2017-12-24 ENCOUNTER — Emergency Department: Admit: 2017-12-24 | Payer: MEDICAID

## 2017-12-24 ENCOUNTER — Inpatient Hospital Stay
Admit: 2017-12-24 | Discharge: 2017-12-24 | Disposition: A | Discharge status: Home / Self Care | Payer: MEDICAID | Attending: Emergency Medicine

## 2017-12-24 LAB — CBC WITH AUTO DIFFERENTIAL
Basophils %: 0 % (ref 0–1)
Basophils Absolute: 0 10*3/uL (ref 0.0–0.1)
Eosinophils %: 1 % (ref 0–7)
Eosinophils Absolute: 0.1 10*3/uL (ref 0.0–0.4)
Granulocyte Absolute Count: 0 10*3/uL (ref 0.00–0.04)
Hematocrit: 40.1 % (ref 35.0–47.0)
Hemoglobin: 13.2 g/dL (ref 11.5–16.0)
Immature Granulocytes: 0 % (ref 0.0–0.5)
Lymphocytes %: 14 % (ref 12–49)
Lymphocytes Absolute: 1.5 10*3/uL (ref 0.8–3.5)
MCH: 31.4 PG (ref 26.0–34.0)
MCHC: 32.9 g/dL (ref 30.0–36.5)
MCV: 95.5 FL (ref 80.0–99.0)
MPV: 9.6 FL (ref 8.9–12.9)
Monocytes %: 6 % (ref 5–13)
Monocytes Absolute: 0.6 10*3/uL (ref 0.0–1.0)
NRBC Absolute: 0 10*3/uL (ref 0.00–0.01)
Neutrophils %: 79 % — ABNORMAL HIGH (ref 32–75)
Neutrophils Absolute: 8.5 10*3/uL — ABNORMAL HIGH (ref 1.8–8.0)
Nucleated RBCs: 0 PER 100 WBC
Platelets: 266 10*3/uL (ref 150–400)
RBC: 4.2 M/uL (ref 3.80–5.20)
RDW: 11.9 % (ref 11.5–14.5)
WBC: 10.7 10*3/uL (ref 3.6–11.0)

## 2017-12-24 LAB — EKG 12-LEAD
Atrial Rate: 87 {beats}/min
Diagnosis: NORMAL
P Axis: 12 degrees
P-R Interval: 142 ms
Q-T Interval: 400 ms
QRS Duration: 102 ms
QTc Calculation (Bazett): 481 ms
R Axis: 23 degrees
T Axis: -5 degrees
Ventricular Rate: 87 {beats}/min

## 2017-12-24 LAB — COMPREHENSIVE METABOLIC PANEL
ALT: 38 U/L (ref 12–78)
AST: 22 U/L (ref 15–37)
Albumin/Globulin Ratio: 1 — ABNORMAL LOW (ref 1.1–2.2)
Albumin: 3.9 g/dL (ref 3.5–5.0)
Alkaline Phosphatase: 88 U/L (ref 45–117)
Anion Gap: 7 mmol/L (ref 5–15)
BUN: 13 MG/DL (ref 6–20)
Bun/Cre Ratio: 13 (ref 12–20)
CO2: 24 mmol/L (ref 21–32)
Calcium: 8.8 MG/DL (ref 8.5–10.1)
Chloride: 107 mmol/L (ref 97–108)
Creatinine: 1.02 MG/DL (ref 0.55–1.02)
EGFR IF NonAfrican American: >60 mL/min/{1.73_m2} (ref >60)
GFR African American: >60 mL/min/{1.73_m2} (ref >60)
Globulin: 3.8 g/dL (ref 2.0–4.0)
Glucose: 152 mg/dL — ABNORMAL HIGH (ref 65–100)
Potassium: 3.9 mmol/L (ref 3.5–5.1)
Sodium: 138 mmol/L (ref 136–145)
Total Bilirubin: 0.5 MG/DL (ref 0.2–1.0)
Total Protein: 7.7 g/dL (ref 6.4–8.2)

## 2017-12-24 LAB — HCG URINE, QL. - POC
HCG, Pregnancy, Urine, POC: NEGATIVE
Pregnancy test,urine (POC): NEGATIVE

## 2017-12-24 LAB — TROPONIN: Troponin I: <0.05 ng/mL (ref <0.05)

## 2017-12-24 LAB — CBC WITH AUTOMATED DIFF
ABS. BASOPHILS: 0 10*3/uL (ref 0.0–0.1)
ABS. EOSINOPHILS: 0.1 10*3/uL (ref 0.0–0.4)
ABS. IMM. GRANS.: 0 10*3/uL (ref 0.00–0.04)
ABS. LYMPHOCYTES: 1.5 10*3/uL (ref 0.8–3.5)
ABS. MONOCYTES: 0.6 10*3/uL (ref 0.0–1.0)
ABS. NEUTROPHILS: 8.5 10*3/uL — ABNORMAL HIGH (ref 1.8–8.0)
ABSOLUTE NRBC: 0 10*3/uL (ref 0.00–0.01)
BASOPHILS: 0 % (ref 0–1)
EOSINOPHILS: 1 % (ref 0–7)
HCT: 40.1 % (ref 35.0–47.0)
HGB: 13.2 g/dL (ref 11.5–16.0)
IMMATURE GRANULOCYTES: 0 % (ref 0.0–0.5)
LYMPHOCYTES: 14 % (ref 12–49)
MCH: 31.4 PG (ref 26.0–34.0)
MCHC: 32.9 g/dL (ref 30.0–36.5)
MCV: 95.5 FL (ref 80.0–99.0)
MONOCYTES: 6 % (ref 5–13)
MPV: 9.6 FL (ref 8.9–12.9)
NEUTROPHILS: 79 % — ABNORMAL HIGH (ref 32–75)
NRBC: 0 PER 100 WBC
PLATELET: 266 10*3/uL (ref 150–400)
RBC: 4.2 M/uL (ref 3.80–5.20)
RDW: 11.9 % (ref 11.5–14.5)
WBC: 10.7 10*3/uL (ref 3.6–11.0)

## 2017-12-24 LAB — EKG, 12 LEAD, INITIAL
Atrial Rate: 87 {beats}/min
Calculated P Axis: 12 degrees
Calculated R Axis: 23 degrees
Calculated T Axis: -5 degrees
Diagnosis: NORMAL
P-R Interval: 142 ms
Q-T Interval: 400 ms
QRS Duration: 102 ms
QTC Calculation (Bezet): 481 ms
Ventricular Rate: 87 {beats}/min

## 2017-12-24 LAB — METABOLIC PANEL, COMPREHENSIVE
A-G Ratio: 1 — ABNORMAL LOW (ref 1.1–2.2)
ALT (SGPT): 38 U/L (ref 12–78)
AST (SGOT): 22 U/L (ref 15–37)
Albumin: 3.9 g/dL (ref 3.5–5.0)
Alk. phosphatase: 88 U/L (ref 45–117)
Anion gap: 7 mmol/L (ref 5–15)
BUN/Creatinine ratio: 13 (ref 12–20)
BUN: 13 MG/DL (ref 6–20)
Bilirubin, total: 0.5 MG/DL (ref 0.2–1.0)
CO2: 24 mmol/L (ref 21–32)
Calcium: 8.8 MG/DL (ref 8.5–10.1)
Chloride: 107 mmol/L (ref 97–108)
Creatinine: 1.02 MG/DL (ref 0.55–1.02)
GFR est AA: >60 mL/min/{1.73_m2} (ref >60)
GFR est non-AA: >60 mL/min/{1.73_m2} (ref >60)
Globulin: 3.8 g/dL (ref 2.0–4.0)
Glucose: 152 mg/dL — ABNORMAL HIGH (ref 65–100)
Potassium: 3.9 mmol/L (ref 3.5–5.1)
Protein, total: 7.7 g/dL (ref 6.4–8.2)
Sodium: 138 mmol/L (ref 136–145)

## 2017-12-24 LAB — SAMPLES BEING HELD

## 2017-12-24 LAB — TROPONIN I: Troponin-I, Qt.: <0.05 ng/mL (ref <0.05)

## 2017-12-24 NOTE — ED Provider Notes (Signed)
37 y.o. female with no significant past medical history presents ambulatory and accompanied by husband with chief complaint of chest pain and shortness of breath that started acutely last night around 9:30 PM. Pt explains that she is still getting over a "15-day bout of respiratory virus" that largely persists as a cough. She describes the cough as "not respiratory-related," stating that it arises after she feels her heart skip a beat. Pt further indicates taht she then saw her PCP, where she was sent home after having a normal workup and EKG. Today, the pt presents secondary to acute onset of chest pain and shortness of breath after coming home from [facility]. She also reports associated bilateral upper extremity numbness and anxiety. Pt includes that she took 2-325 mg ASA PTA. Pt denies any cardiac or pulmonary history. Pt denies any fevers, LE swelling, fevers, chills, or any other symptoms at this time. There are no other acute medical concerns at this time.    Social hx: None  PCP: None    Note written by Edmund Hilda, Scribe, as dictated by Gaylyn Lambert, MD 12:09 AM      The history is provided by the patient and the spouse. No language interpreter was used.        No past medical history on file.    No past surgical history on file.      No family history on file.    Social History     Socioeconomic History   ??? Marital status: MARRIED     Spouse name: Not on file   ??? Number of children: Not on file   ??? Years of education: Not on file   ??? Highest education level: Not on file   Occupational History   ??? Not on file   Social Needs   ??? Financial resource strain: Not on file   ??? Food insecurity:     Worry: Not on file     Inability: Not on file   ??? Transportation needs:     Medical: Not on file     Non-medical: Not on file   Tobacco Use   ??? Smoking status: Not on file   Substance and Sexual Activity   ??? Alcohol use: Not on file   ??? Drug use: Not on file   ??? Sexual activity: Not on file   Lifestyle   ???  Physical activity:     Days per week: Not on file     Minutes per session: Not on file   ??? Stress: Not on file   Relationships   ??? Social connections:     Talks on phone: Not on file     Gets together: Not on file     Attends religious service: Not on file     Active member of club or organization: Not on file     Attends meetings of clubs or organizations: Not on file     Relationship status: Not on file   ??? Intimate partner violence:     Fear of current or ex partner: Not on file     Emotionally abused: Not on file     Physically abused: Not on file     Forced sexual activity: Not on file   Other Topics Concern   ??? Not on file   Social History Narrative   ??? Not on file         ALLERGIES: Peanut; Shellfish derived; and Iodine    Review  of Systems   Constitutional: Negative for chills and fever.   HENT: Negative for congestion and sore throat.    Eyes: Negative for pain.   Respiratory: Positive for shortness of breath.    Cardiovascular: Positive for chest pain. Negative for leg swelling.   Gastrointestinal: Negative for abdominal pain, diarrhea, nausea and vomiting.   Genitourinary: Negative for dysuria and flank pain.   Musculoskeletal: Negative for back pain and neck pain.   Skin: Negative for rash.   Neurological: Negative for dizziness and headaches.   Psychiatric/Behavioral: The patient is nervous/anxious.        Vitals:    12/23/17 2306   BP: 139/87   Pulse: 86   Resp: 22   Temp: 98.3 ??F (36.8 ??C)   SpO2: 99%   Weight: 95.3 kg (210 lb)   Height: 5' 7"  (1.702 m)            Physical Exam   Constitutional: She is oriented to person, place, and time. She appears well-developed and well-nourished.   HENT:   Head: Normocephalic.   Mouth/Throat: Oropharynx is clear and moist.   Eyes: Conjunctivae are normal.   Neck: Normal range of motion. Neck supple.   Cardiovascular: Normal rate and regular rhythm.   Pulmonary/Chest: Effort normal and breath sounds normal. No respiratory distress.   Abdominal: Soft. Bowel sounds  are normal. There is no tenderness.   Musculoskeletal: Normal range of motion.   Neurological: She is alert and oriented to person, place, and time.   No gross motor or sensory deficits   Skin: Skin is warm. Capillary refill takes less than 2 seconds. No rash noted.   Nursing note and vitals reviewed.    Note written by Edmund Hilda, Scribe, as dictated by Gaylyn Lambert, MD 12:09 AM     MDM  Number of Diagnoses or Management Options  Palpitations:   PVC (premature ventricular contraction):   SOB (shortness of breath):   Diagnosis management comments: Patient presents to ER with complaints of palpitations and shortness of breath  Patient reports having recent viral-like symptoms still feeling under the weather. No fevers or chills. No productive cough.  Vitals unremarkable. Lungs clear. Chest x-ray clear. Well as low risk, PERC out  HEART score low risk  On cardiac monitor patient having intermittent PVC likely explain patient's palpitation-like sensation. Patient is scheduled for Holter monitor outpatient.    Discussed the discharge impression and any labs and the results with the patient. Answered any questions and addressed any concerns. Discussed the importance of following up with their primary care provider and/or specialist.  Discussed signs or symptoms that would warrant return back to the ER for further evaluation. The patient is agreeable with discharge.             Amount and/or Complexity of Data Reviewed  Clinical lab tests: reviewed  Tests in the radiology section of CPT??: reviewed           Procedures  ED EKG interpretation:  Rhythm: normal sinus rhythm; and regular . Rate (approx.): 87; Axis: normal; ST/T wave: normal  Note written by Edmund Hilda, Scribe, as dictated by Gaylyn Lambert, MD 12:10 AM    1:42 AM  The patient has been updated on results, and has been planned for discharge. Instructed the patient to follow-up with PCP, and to return to the ED should any new symptoms arise or  worsen. Patient agreeable to plan.    1:42 AM  Patient's results  have been reviewed with them.  Patient and/or family have verbally conveyed their understanding and agreement of the patient's signs, symptoms, diagnosis, treatment and prognosis and additionally agree to follow up as recommended or return to the Emergency Room should their condition change prior to follow-up.  Discharge instructions have also been provided to the patient with some educational information regarding their diagnosis as well a list of reasons why they would want to return to the ER prior to their follow-up appointment should their condition change.         Recent Results (from the past 24 hour(s))   CBC WITH AUTOMATED DIFF    Collection Time: 12/24/17 12:44 AM   Result Value Ref Range    WBC 10.7 3.6 - 11.0 K/uL    RBC 4.20 3.80 - 5.20 M/uL    HGB 13.2 11.5 - 16.0 g/dL    HCT 40.1 35.0 - 47.0 %    MCV 95.5 80.0 - 99.0 FL    MCH 31.4 26.0 - 34.0 PG    MCHC 32.9 30.0 - 36.5 g/dL    RDW 11.9 11.5 - 14.5 %    PLATELET 266 150 - 400 K/uL    MPV 9.6 8.9 - 12.9 FL    NRBC 0.0 0 PER 100 WBC    ABSOLUTE NRBC 0.00 0.00 - 0.01 K/uL    NEUTROPHILS 79 (H) 32 - 75 %    LYMPHOCYTES 14 12 - 49 %    MONOCYTES 6 5 - 13 %    EOSINOPHILS 1 0 - 7 %    BASOPHILS 0 0 - 1 %    IMMATURE GRANULOCYTES 0 0.0 - 0.5 %    ABS. NEUTROPHILS 8.5 (H) 1.8 - 8.0 K/UL    ABS. LYMPHOCYTES 1.5 0.8 - 3.5 K/UL    ABS. MONOCYTES 0.6 0.0 - 1.0 K/UL    ABS. EOSINOPHILS 0.1 0.0 - 0.4 K/UL    ABS. BASOPHILS 0.0 0.0 - 0.1 K/UL    ABS. IMM. GRANS. 0.0 0.00 - 0.04 K/UL    DF AUTOMATED     METABOLIC PANEL, COMPREHENSIVE    Collection Time: 12/24/17 12:44 AM   Result Value Ref Range    Sodium 138 136 - 145 mmol/L    Potassium 3.9 3.5 - 5.1 mmol/L    Chloride 107 97 - 108 mmol/L    CO2 24 21 - 32 mmol/L    Anion gap 7 5 - 15 mmol/L    Glucose 152 (H) 65 - 100 mg/dL    BUN 13 6 - 20 MG/DL    Creatinine 1.02 0.55 - 1.02 MG/DL    BUN/Creatinine ratio 13 12 - 20      GFR est AA >60 >60  ml/min/1.51m    GFR est non-AA >60 >60 ml/min/1.775m   Calcium 8.8 8.5 - 10.1 MG/DL    Bilirubin, total 0.5 0.2 - 1.0 MG/DL    ALT (SGPT) 38 12 - 78 U/L    AST (SGOT) 22 15 - 37 U/L    Alk. phosphatase 88 45 - 117 U/L    Protein, total 7.7 6.4 - 8.2 g/dL    Albumin 3.9 3.5 - 5.0 g/dL    Globulin 3.8 2.0 - 4.0 g/dL    A-G Ratio 1.0 (L) 1.1 - 2.2     TROPONIN I    Collection Time: 12/24/17 12:44 AM   Result Value Ref Range    Troponin-I, Qt. <0.05 <0.05 ng/mL   HCG URINE, QL. -  POC    Collection Time: 12/24/17 12:48 AM   Result Value Ref Range    Pregnancy test,urine (POC) NEGATIVE  NEG     SAMPLES BEING HELD    Collection Time: 12/24/17 12:49 AM   Result Value Ref Range    SAMPLES BEING HELD 1RED,1BLU,1SST,1UA     COMMENT        Add-on orders for these samples will be processed based on acceptable specimen integrity and analyte stability, which may vary by analyte.       Xr Chest Pa Lat    Result Date: 12/24/2017  CLINICAL HISTORY: Dyspnea INDICATION: Dyspnea COMPARISON: None FINDINGS: PA and lateral views of the chest are obtained. The cardiopericardial silhouette is within normal limits. There is no pleural effusion, pneumothorax or focal consolidation present.     IMPRESSION: No acute intrathoracic disease.

## 2017-12-24 NOTE — ED Notes (Signed)
Discharge given by physician. Pt and family deny any questions.

## 2017-12-24 NOTE — ED Notes (Signed)
Discharge given by physician. Pt and family deny any questions.

## 2017-12-24 NOTE — ED Provider Notes (Signed)
37 y.o. female with no significant past medical history presents ambulatory and accompanied by husband with chief complaint of chest pain and shortness of breath that started acutely last night around 9:30 PM. Pt explains that she is still getting over a "15-day bout of respiratory virus" that largely persists as a cough. She describes the cough as "not respiratory-related," stating that it arises after she feels her heart skip a beat. Pt further indicates taht she then saw her PCP, where she was sent home after having a normal workup and EKG. Today, the pt presents secondary to acute onset of chest pain and shortness of breath after coming home from [facility]. She also reports associated bilateral upper extremity numbness and anxiety. Pt includes that she took 2-325 mg ASA PTA. Pt denies any cardiac or pulmonary history. Pt denies any fevers, LE swelling, fevers, chills, or any other symptoms at this time. There are no other acute medical concerns at this time.    Social hx: None  PCP: None    Note written by Edmund Hilda, Scribe, as dictated by Gaylyn Lambert, MD 12:09 AM      The history is provided by the patient and the spouse. No language interpreter was used.        No past medical history on file.    No past surgical history on file.      No family history on file.    Social History     Socioeconomic History   ??? Marital status: MARRIED     Spouse name: Not on file   ??? Number of children: Not on file   ??? Years of education: Not on file   ??? Highest education level: Not on file   Occupational History   ??? Not on file   Social Needs   ??? Financial resource strain: Not on file   ??? Food insecurity:     Worry: Not on file     Inability: Not on file   ??? Transportation needs:     Medical: Not on file     Non-medical: Not on file   Tobacco Use   ??? Smoking status: Not on file   Substance and Sexual Activity   ??? Alcohol use: Not on file   ??? Drug use: Not on file   ??? Sexual activity: Not on file   Lifestyle    ??? Physical activity:     Days per week: Not on file     Minutes per session: Not on file   ??? Stress: Not on file   Relationships   ??? Social connections:     Talks on phone: Not on file     Gets together: Not on file     Attends religious service: Not on file     Active member of club or organization: Not on file     Attends meetings of clubs or organizations: Not on file     Relationship status: Not on file   ??? Intimate partner violence:     Fear of current or ex partner: Not on file     Emotionally abused: Not on file     Physically abused: Not on file     Forced sexual activity: Not on file   Other Topics Concern   ??? Not on file   Social History Narrative   ??? Not on file         ALLERGIES: Peanut; Shellfish derived; and Iodine    Review  of Systems   Constitutional: Negative for chills and fever.   HENT: Negative for congestion and sore throat.    Eyes: Negative for pain.   Respiratory: Positive for shortness of breath.    Cardiovascular: Positive for chest pain. Negative for leg swelling.   Gastrointestinal: Negative for abdominal pain, diarrhea, nausea and vomiting.   Genitourinary: Negative for dysuria and flank pain.   Musculoskeletal: Negative for back pain and neck pain.   Skin: Negative for rash.   Neurological: Negative for dizziness and headaches.   Psychiatric/Behavioral: The patient is nervous/anxious.        Vitals:    12/23/17 2306   BP: 139/87   Pulse: 86   Resp: 22   Temp: 98.3 ??F (36.8 ??C)   SpO2: 99%   Weight: 95.3 kg (210 lb)   Height: '5\' 7"'$  (1.702 m)            Physical Exam   Constitutional: She is oriented to person, place, and time. She appears well-developed and well-nourished.   HENT:   Head: Normocephalic.   Mouth/Throat: Oropharynx is clear and moist.   Eyes: Conjunctivae are normal.   Neck: Normal range of motion. Neck supple.   Cardiovascular: Normal rate and regular rhythm.   Pulmonary/Chest: Effort normal and breath sounds normal. No respiratory distress.    Abdominal: Soft. Bowel sounds are normal. There is no tenderness.   Musculoskeletal: Normal range of motion.   Neurological: She is alert and oriented to person, place, and time.   No gross motor or sensory deficits   Skin: Skin is warm. Capillary refill takes less than 2 seconds. No rash noted.   Nursing note and vitals reviewed.    Note written by Edmund Hilda, Scribe, as dictated by Gaylyn Lambert, MD 12:09 AM     MDM  Number of Diagnoses or Management Options  Palpitations:   PVC (premature ventricular contraction):   SOB (shortness of breath):   Diagnosis management comments: Patient presents to ER with complaints of palpitations and shortness of breath  Patient reports having recent viral-like symptoms still feeling under the weather. No fevers or chills. No productive cough.  Vitals unremarkable. Lungs clear. Chest x-ray clear. Well as low risk, PERC out  HEART score low risk  On cardiac monitor patient having intermittent PVC likely explain patient's palpitation-like sensation. Patient is scheduled for Holter monitor outpatient.    Discussed the discharge impression and any labs and the results with the patient. Answered any questions and addressed any concerns. Discussed the importance of following up with their primary care provider and/or specialist.  Discussed signs or symptoms that would warrant return back to the ER for further evaluation. The patient is agreeable with discharge.             Amount and/or Complexity of Data Reviewed  Clinical lab tests: reviewed  Tests in the radiology section of CPT??: reviewed           Procedures  ED EKG interpretation:  Rhythm: normal sinus rhythm; and regular . Rate (approx.): 87; Axis: normal; ST/T wave: normal  Note written by Edmund Hilda, Scribe, as dictated by Gaylyn Lambert, MD 12:10 AM    1:42 AM  The patient has been updated on results, and has been planned for discharge. Instructed the patient to follow-up with PCP, and to return to  the ED should any new symptoms arise or worsen. Patient agreeable to plan.    1:42 AM  Patient's results  have been reviewed with them.  Patient and/or family have verbally conveyed their understanding and agreement of the patient's signs, symptoms, diagnosis, treatment and prognosis and additionally agree to follow up as recommended or return to the Emergency Room should their condition change prior to follow-up.  Discharge instructions have also been provided to the patient with some educational information regarding their diagnosis as well a list of reasons why they would want to return to the ER prior to their follow-up appointment should their condition change.         Recent Results (from the past 24 hour(s))   CBC WITH AUTOMATED DIFF    Collection Time: 12/24/17 12:44 AM   Result Value Ref Range    WBC 10.7 3.6 - 11.0 K/uL    RBC 4.20 3.80 - 5.20 M/uL    HGB 13.2 11.5 - 16.0 g/dL    HCT 40.1 35.0 - 47.0 %    MCV 95.5 80.0 - 99.0 FL    MCH 31.4 26.0 - 34.0 PG    MCHC 32.9 30.0 - 36.5 g/dL    RDW 11.9 11.5 - 14.5 %    PLATELET 266 150 - 400 K/uL    MPV 9.6 8.9 - 12.9 FL    NRBC 0.0 0 PER 100 WBC    ABSOLUTE NRBC 0.00 0.00 - 0.01 K/uL    NEUTROPHILS 79 (H) 32 - 75 %    LYMPHOCYTES 14 12 - 49 %    MONOCYTES 6 5 - 13 %    EOSINOPHILS 1 0 - 7 %    BASOPHILS 0 0 - 1 %    IMMATURE GRANULOCYTES 0 0.0 - 0.5 %    ABS. NEUTROPHILS 8.5 (H) 1.8 - 8.0 K/UL    ABS. LYMPHOCYTES 1.5 0.8 - 3.5 K/UL    ABS. MONOCYTES 0.6 0.0 - 1.0 K/UL    ABS. EOSINOPHILS 0.1 0.0 - 0.4 K/UL    ABS. BASOPHILS 0.0 0.0 - 0.1 K/UL    ABS. IMM. GRANS. 0.0 0.00 - 0.04 K/UL    DF AUTOMATED     METABOLIC PANEL, COMPREHENSIVE    Collection Time: 12/24/17 12:44 AM   Result Value Ref Range    Sodium 138 136 - 145 mmol/L    Potassium 3.9 3.5 - 5.1 mmol/L    Chloride 107 97 - 108 mmol/L    CO2 24 21 - 32 mmol/L    Anion gap 7 5 - 15 mmol/L    Glucose 152 (H) 65 - 100 mg/dL    BUN 13 6 - 20 MG/DL    Creatinine 1.02 0.55 - 1.02 MG/DL     BUN/Creatinine ratio 13 12 - 20      GFR est AA >60 >60 ml/min/1.50m    GFR est non-AA >60 >60 ml/min/1.723m   Calcium 8.8 8.5 - 10.1 MG/DL    Bilirubin, total 0.5 0.2 - 1.0 MG/DL    ALT (SGPT) 38 12 - 78 U/L    AST (SGOT) 22 15 - 37 U/L    Alk. phosphatase 88 45 - 117 U/L    Protein, total 7.7 6.4 - 8.2 g/dL    Albumin 3.9 3.5 - 5.0 g/dL    Globulin 3.8 2.0 - 4.0 g/dL    A-G Ratio 1.0 (L) 1.1 - 2.2     TROPONIN I    Collection Time: 12/24/17 12:44 AM   Result Value Ref Range    Troponin-I, Qt. <0.05 <0.05 ng/mL   HCG URINE, QL. -  POC    Collection Time: 12/24/17 12:48 AM   Result Value Ref Range    Pregnancy test,urine (POC) NEGATIVE  NEG     SAMPLES BEING HELD    Collection Time: 12/24/17 12:49 AM   Result Value Ref Range    SAMPLES BEING HELD 1RED,1BLU,1SST,1UA     COMMENT        Add-on orders for these samples will be processed based on acceptable specimen integrity and analyte stability, which may vary by analyte.       Xr Chest Pa Lat    Result Date: 12/24/2017  CLINICAL HISTORY: Dyspnea INDICATION: Dyspnea COMPARISON: None FINDINGS: PA and lateral views of the chest are obtained. The cardiopericardial silhouette is within normal limits. There is no pleural effusion, pneumothorax or focal consolidation present.     IMPRESSION: No acute intrathoracic disease.

## 2020-03-31 ENCOUNTER — Emergency Department (HOSPITAL_COMMUNITY)
Admission: EM | Admit: 2020-03-31 | Discharge: 2020-03-31 | Disposition: A | Discharge status: Home / Self Care | Payer: BC Managed Care – PPO | Attending: Emergency Medicine | Admitting: Emergency Medicine

## 2020-03-31 ENCOUNTER — Encounter (HOSPITAL_COMMUNITY): Payer: Self-pay | Admitting: *Deleted

## 2020-03-31 ENCOUNTER — Other Ambulatory Visit: Payer: Self-pay

## 2020-03-31 DIAGNOSIS — M549 Dorsalgia, unspecified: Secondary | ICD-10-CM | POA: Insufficient documentation

## 2020-03-31 DIAGNOSIS — Z5321 Procedure and treatment not carried out due to patient leaving prior to being seen by health care provider: Secondary | ICD-10-CM | POA: Insufficient documentation

## 2020-03-31 DIAGNOSIS — R109 Unspecified abdominal pain: Secondary | ICD-10-CM | POA: Insufficient documentation

## 2020-03-31 LAB — CBC
HCT: 39.9 % (ref 36.0–46.0)
Hemoglobin: 12.9 g/dL (ref 12.0–15.0)
MCH: 31 pg (ref 26.0–34.0)
MCHC: 32.3 g/dL (ref 30.0–36.0)
MCV: 95.9 fL (ref 80.0–100.0)
Platelets: 261 10*3/uL (ref 150–400)
RBC: 4.16 MIL/uL (ref 3.87–5.11)
RDW: 11.9 % (ref 11.5–15.5)
WBC: 7.9 10*3/uL (ref 4.0–10.5)
nRBC: 0 % (ref 0.0–0.2)

## 2020-03-31 LAB — COMPREHENSIVE METABOLIC PANEL
ALT: 32 U/L (ref 0–44)
AST: 22 U/L (ref 15–41)
Albumin: 3.8 g/dL (ref 3.5–5.0)
Alkaline Phosphatase: 59 U/L (ref 38–126)
Anion gap: 12 (ref 5–15)
BUN: 10 mg/dL (ref 6–20)
CO2: 25 mmol/L (ref 22–32)
Calcium: 9.3 mg/dL (ref 8.9–10.3)
Chloride: 102 mmol/L (ref 98–111)
Creatinine, Ser: 0.8 mg/dL (ref 0.44–1.00)
GFR calc Af Amer: >60 mL/min (ref >60)
GFR calc non Af Amer: >60 mL/min (ref >60)
Glucose, Bld: 118 mg/dL — ABNORMAL HIGH (ref 70–99)
Potassium: 3.8 mmol/L (ref 3.5–5.1)
Sodium: 139 mmol/L (ref 135–145)
Total Bilirubin: 1 mg/dL (ref 0.3–1.2)
Total Protein: 6.8 g/dL (ref 6.5–8.1)

## 2020-03-31 LAB — URINALYSIS, ROUTINE W REFLEX MICROSCOPIC
Bilirubin Urine: NEGATIVE
Glucose, UA: NEGATIVE mg/dL
Ketones, ur: NEGATIVE mg/dL
Nitrite: POSITIVE — AB
Protein, ur: 30 mg/dL — AB
Specific Gravity, Urine: 1.015 (ref 1.005–1.030)
WBC, UA: >50 WBC/hpf — ABNORMAL HIGH (ref 0–5)
pH: 5 (ref 5.0–8.0)

## 2020-03-31 LAB — LIPASE, BLOOD: Lipase: 27 U/L (ref 11–51)

## 2020-03-31 LAB — I-STAT BETA HCG BLOOD, ED (MC, WL, AP ONLY): I-stat hCG, quantitative: <5 m[IU]/mL (ref <5)

## 2020-03-31 NOTE — ED Notes (Addendum)
Pt states she is leaving to be seen by her PCP. Wristband and stickers removed.

## 2020-03-31 NOTE — ED Triage Notes (Signed)
Pt reports recent urinary symptoms. Pt went to an ucc and started on antibiotics but was told culture was negative. Yesterday did lifting at work and then onset of left groin pain that radiates around abd and back. Reports return of her urinary symptoms and difficulty/pain with urination. No distress is noted at triage.

## 2022-08-30 ENCOUNTER — Encounter (HOSPITAL_COMMUNITY): Payer: Self-pay | Admitting: Emergency Medicine

## 2022-08-30 ENCOUNTER — Emergency Department (HOSPITAL_COMMUNITY): Payer: BC Managed Care – PPO

## 2022-08-30 ENCOUNTER — Emergency Department (HOSPITAL_COMMUNITY)
Admission: EM | Admit: 2022-08-30 | Discharge: 2022-08-30 | Disposition: A | Discharge status: Home / Self Care | Payer: BC Managed Care – PPO | Attending: Emergency Medicine | Admitting: Emergency Medicine

## 2022-08-30 ENCOUNTER — Other Ambulatory Visit: Payer: Self-pay

## 2022-08-30 DIAGNOSIS — Z91010 Allergy to peanuts: Secondary | ICD-10-CM | POA: Insufficient documentation

## 2022-08-30 DIAGNOSIS — R319 Hematuria, unspecified: Secondary | ICD-10-CM | POA: Diagnosis present

## 2022-08-30 DIAGNOSIS — N390 Urinary tract infection, site not specified: Secondary | ICD-10-CM | POA: Insufficient documentation

## 2022-08-30 DIAGNOSIS — R079 Chest pain, unspecified: Secondary | ICD-10-CM | POA: Diagnosis not present

## 2022-08-30 HISTORY — DX: Unspecified osteoarthritis, unspecified site: M19.90

## 2022-08-30 LAB — URINALYSIS, ROUTINE W REFLEX MICROSCOPIC
Bilirubin Urine: NEGATIVE
Glucose, UA: NEGATIVE mg/dL
Ketones, ur: NEGATIVE mg/dL
Nitrite: POSITIVE — AB
Protein, ur: NEGATIVE mg/dL
Specific Gravity, Urine: 1.011 (ref 1.005–1.030)
WBC, UA: >50 WBC/hpf (ref 0–5)
pH: 5 (ref 5.0–8.0)

## 2022-08-30 LAB — BASIC METABOLIC PANEL
Anion gap: 9 (ref 5–15)
BUN: 12 mg/dL (ref 6–20)
CO2: 25 mmol/L (ref 22–32)
Calcium: 9.3 mg/dL (ref 8.9–10.3)
Chloride: 101 mmol/L (ref 98–111)
Creatinine, Ser: 0.83 mg/dL (ref 0.44–1.00)
GFR, Estimated: >60 mL/min (ref >60)
Glucose, Bld: 130 mg/dL — ABNORMAL HIGH (ref 70–99)
Potassium: 3.6 mmol/L (ref 3.5–5.1)
Sodium: 135 mmol/L (ref 135–145)

## 2022-08-30 LAB — CBC
HCT: 40.9 % (ref 36.0–46.0)
Hemoglobin: 13.6 g/dL (ref 12.0–15.0)
MCH: 32.1 pg (ref 26.0–34.0)
MCHC: 33.3 g/dL (ref 30.0–36.0)
MCV: 96.5 fL (ref 80.0–100.0)
Platelets: 275 10*3/uL (ref 150–400)
RBC: 4.24 MIL/uL (ref 3.87–5.11)
RDW: 11.8 % (ref 11.5–15.5)
WBC: 8.9 10*3/uL (ref 4.0–10.5)
nRBC: 0 % (ref 0.0–0.2)

## 2022-08-30 LAB — HEPATIC FUNCTION PANEL
ALT: 29 U/L (ref 0–44)
AST: 24 U/L (ref 15–41)
Albumin: 3.8 g/dL (ref 3.5–5.0)
Alkaline Phosphatase: 69 U/L (ref 38–126)
Bilirubin, Direct: 0.1 mg/dL (ref 0.0–0.2)
Indirect Bilirubin: 0.5 mg/dL (ref 0.3–0.9)
Total Bilirubin: 0.6 mg/dL (ref 0.3–1.2)
Total Protein: 7 g/dL (ref 6.5–8.1)

## 2022-08-30 LAB — I-STAT BETA HCG BLOOD, ED (MC, WL, AP ONLY): I-stat hCG, quantitative: <5 m[IU]/mL (ref <5)

## 2022-08-30 LAB — TROPONIN I (HIGH SENSITIVITY): Troponin I (High Sensitivity): <2 ng/L (ref <18)

## 2022-08-30 MED ORDER — CEPHALEXIN 250 MG PO CAPS
500.0000 mg | ORAL_CAPSULE | Freq: Once | ORAL | Status: AC
  Administered 2022-08-30: 500 mg via ORAL
  Filled 2022-08-30: qty 2

## 2022-08-30 MED ORDER — ACETAMINOPHEN 325 MG PO TABS
650.0000 mg | ORAL_TABLET | Freq: Once | ORAL | Status: AC
  Administered 2022-08-30: 650 mg via ORAL
  Filled 2022-08-30: qty 2

## 2022-08-30 MED ORDER — CEPHALEXIN 500 MG PO CAPS: 500.0000 mg | ORAL_CAPSULE | Freq: Two times a day (BID) | ORAL | 0 refills | Status: AC

## 2022-08-30 MED ORDER — KETOROLAC TROMETHAMINE 15 MG/ML IJ SOLN
15.0000 mg | Freq: Once | INTRAMUSCULAR | Status: AC
  Administered 2022-08-30: 15 mg via INTRAVENOUS
  Filled 2022-08-30: qty 1

## 2022-08-30 NOTE — ED Provider Notes (Signed)
[facility] Provider Note   CSN: [id number] Arrival date & time: 08/30/22  [id number]     History  Chief Complaint  Patient presents with   Chest Pain    Allison Carlson is a 42 y.o. female.  Patient is a 42 year old female with a past medical history of psoriatic arthritis presenting to the emergency department with shortness of breath and dysuria.  The patient states that she was at work last night when she had an episode of feeling like she was having difficulty taking a deep breath and became lightheaded.  She states that she then went to the bathroom and started to have dysuria.  She states that she took an Azo without any improvement.  She states that she started to develop right upper quadrant/right flank pain.  She states that she then woke up at 5 AM this morning with chest tightness and again pain with taking deep breaths.  She denies any fevers, nausea, vomiting.  She states that she does frequently get UTIs.  She endorses hematuria.  She denies any history of blood clots, lower extremity swelling, recent hospitalizations or surgery, recent long travels in the car or plane, estrogen use or cancer history.  The history is provided by the patient.  Chest Pain      Home Medications Prior to Admission medications   Medication Sig Start Date End Date Taking? Authorizing Provider  cephALEXin (KEFLEX) 500 MG capsule Take 1 capsule (500 mg total) by mouth 2 (two) times daily for 10 days. 08/30/22 09/09/22 Yes Kingsley, Jordan Hawks K, DO  cetirizine (ZYRTEC) 10 MG tablet Take 10 mg by mouth daily.    [provider]  citalopram (CELEXA) 20 MG tablet Take 20 mg by mouth daily.    [provider]  methylphenidate (CONCERTA) 18 MG CR tablet Take 18 mg by mouth every morning.    [provider]      Allergies    Peanut-containing drug products, Shellfish allergy, and Iodine    Review of Systems   Review of Systems   Cardiovascular:  Positive for chest pain.    Physical Exam Updated Vital Signs BP 95/76   Pulse 88   Temp 98.1 F (36.7 C)   Resp 16   Ht '5\' 7"'$  (1.702 m)   Wt 91 kg   SpO2 96%   BMI 31.42 kg/m  Physical Exam Vitals and nursing note reviewed.  Constitutional:      General: She is not in acute distress.    Appearance: She is well-developed.  HENT:     Head: Normocephalic and atraumatic.  Eyes:     Extraocular Movements: Extraocular movements intact.  Cardiovascular:     Rate and Rhythm: Normal rate and regular rhythm.     Heart sounds: Normal heart sounds.  Pulmonary:     Effort: Pulmonary effort is normal.     Breath sounds: Normal breath sounds.  Abdominal:     Palpations: Abdomen is soft.     Tenderness: There is abdominal tenderness (Suprapubic). There is no guarding or rebound.     Comments: No CVA tenderness bilaterally  Musculoskeletal:        General: Normal range of motion.     Cervical back: Normal range of motion and neck supple.     Right lower leg: No edema.     Left lower leg: No edema.  Skin:    General: Skin is warm and dry.  Neurological:  General: No focal deficit present.     Mental Status: She is alert and oriented to person, place, and time.  Psychiatric:        Mood and Affect: Mood normal.        Behavior: Behavior normal.     ED Results / Procedures / Treatments   Labs (all labs ordered are listed, but only abnormal results are displayed) Labs Reviewed  BASIC METABOLIC PANEL - Abnormal; Notable for the following components:      Result Value   Glucose, Bld 130 (*)    All other components within normal limits  URINALYSIS, ROUTINE W REFLEX MICROSCOPIC - Abnormal; Notable for the following components:   Color, Urine AMBER (*)    APPearance HAZY (*)    Hgb urine dipstick MODERATE (*)    Nitrite POSITIVE (*)    Leukocytes,Ua MODERATE (*)    Bacteria, UA MANY (*)    All other components within normal limits  CBC  HEPATIC FUNCTION  PANEL  I-STAT BETA HCG BLOOD, ED ([facility], [facility], [facility] ONLY)  TROPONIN I (HIGH SENSITIVITY)    EKG EKG Interpretation  Date/Time:  Thursday August 30 2022 06:31:31 EST Ventricular Rate:  80 PR Interval:  138 QRS Duration: 94 QT Interval:  378 QTC Calculation: 435 R Axis:   65 Text Interpretation: Normal sinus rhythm Cannot rule out Anterior infarct , age undetermined Abnormal ECG No previous ECGs available Confirmed by Leanord Asal ([id number]) on 08/30/2022 7:00:51 AM  Radiology DG Chest 2 View  Result Date: 08/30/2022 CLINICAL DATA:  Chest pain EXAM: CHEST - 2 VIEW COMPARISON:  None Available. FINDINGS: The lungs are clear without focal pneumonia, edema, pneumothorax or pleural effusion. The cardiopericardial silhouette is within normal limits for size. The visualized bony structures of the thorax are unremarkable. IMPRESSION: No active cardiopulmonary disease. Electronically Signed   By: Misty Stanley M.D.   On: 08/30/2022 06:58    Procedures Procedures    Medications Ordered in ED Medications  ketorolac (TORADOL) 15 MG/ML injection 15 mg (15 mg Intravenous Given 08/30/22 0745)  cephALEXin (KEFLEX) capsule 500 mg (500 mg Oral Given 08/30/22 0746)  acetaminophen (TYLENOL) tablet 650 mg (650 mg Oral Given 08/30/22 0745)    ED Course/ Medical Decision Making/ A&P Clinical Course as of 08/30/22 [id number]  Thu Aug 30, 2022  0915 Upon reassessment, the patient's labs are otherwise within normal range and she reports improvement of her pain.  Patient is stable for discharge home on antibiotics.  She was recommended close primary care follow-up and given strict return precautions. [VK]    Clinical Course User Index [VK] Kemper Durie, DO                             Medical Decision Making This patient presents to the ED with chief complaint(s) of SOB, dysuria with pertinent past medical history of psoriatic arthritis which further complicates the presenting complaint. The complaint  involves an extensive differential diagnosis and also carries with it a high risk of complications and morbidity.    The differential diagnosis includes UTI, pyelonephritis, nephrolithiasis less likely as patient is comfortable appearing without any nausea or vomiting, ACS, arrhythmia, pneumonia, pneumothorax, pulmonary edema, pleural effusion, patient is PERC negative making PE unlikely, hepatitis, cholelithiasis, cholecystitis, gastritis, GERD  Additional history obtained: Additional history obtained from N/A Records reviewed Primary Care Documents  ED Course and Reassessment: Patient was initially evaluated by triage and had EKG, labs  and urine performed.  EKG showed normal sinus rhythm without acute ischemic changes.  Urine is positive for UTI with nitrites, leuks and bacteria and she will be started on Keflex.  Pregnancy test is negative.  Patient's troponin and remainder of labs are pending at this time.  She will additionally have LFTs due to her associated right upper quadrant pain.  She was given Toradol and Tylenol for pain and will be closely reassessed.  Independent labs interpretation:  The following labs were independently interpreted: UA positive for UTI, labs otherwise within normal range  Independent visualization of imaging: - I independently visualized the following imaging with scope of interpretation limited to determining acute life threatening conditions related to emergency care: CXR, which revealed no acute disease  Consultation: - Consulted or discussed management/test interpretation w/ external professional: N/A  Consideration for admission or further workup: Patient has no emergent conditions requiring admission or further work-up at this time and is stable for discharge home with primary care follow-up  Social Determinants of health: N/A    Amount and/or Complexity of Data Reviewed Labs: ordered. Radiology: ordered.  Risk OTC drugs. Prescription drug  management.          Final Clinical Impression(s) / ED Diagnoses Final diagnoses:  Urinary tract infection with hematuria, site unspecified  Nonspecific chest pain    Rx / DC Orders ED Discharge Orders          Ordered    cephALEXin (KEFLEX) 500 MG capsule  2 times daily        08/30/22 [place], [place], DO 08/30/22 [phone]

## 2022-08-30 NOTE — ED Triage Notes (Signed)
Pt c/o chest pain that started last night, worse when taking in a deep breath. Pt also c/o right flank pain and dysuria. Pt states that she got dizzy while deep breathing yesterday as well.

## 2022-08-30 NOTE — Discharge Instructions (Signed)
You were seen in the emergency department for your right-sided pain and shortness of breath.  Your workup showed no obvious abnormalities with your heart or lungs but did show that you have a urinary tract infection and you may have an early kidney infection.  I have given you antibiotics and you should complete these as prescribed.  You can take Tylenol or Motrin as needed for pain.  You should follow-up with your primary doctor in the next few days to have your symptoms rechecked.  You should return to the emergency department if you have significantly worsening pain, fevers despite the antibiotics, repetitive vomiting and unable to tolerate the antibiotics or if you have any other new or concerning symptoms.
